# Patient Record
Sex: Female | Born: 1980 | Race: Black or African American | Hispanic: No | Marital: Single | Smoking: Never smoker
Health system: Southern US, Community
[De-identification: ages and names within clinical notes are randomized; demographics above are authoritative.]

## PROBLEM LIST (undated history)

## (undated) DIAGNOSIS — E66812 Obesity, class 2: Secondary | ICD-10-CM

## (undated) DIAGNOSIS — E049 Nontoxic goiter, unspecified: Secondary | ICD-10-CM

## (undated) DIAGNOSIS — J351 Hypertrophy of tonsils: Secondary | ICD-10-CM

## (undated) DIAGNOSIS — E669 Obesity, unspecified: Secondary | ICD-10-CM

## (undated) DIAGNOSIS — I10 Essential (primary) hypertension: Secondary | ICD-10-CM

## (undated) DIAGNOSIS — E785 Hyperlipidemia, unspecified: Secondary | ICD-10-CM

## (undated) DIAGNOSIS — J039 Acute tonsillitis, unspecified: Secondary | ICD-10-CM

## (undated) DIAGNOSIS — B009 Herpesviral infection, unspecified: Secondary | ICD-10-CM

## (undated) DIAGNOSIS — T7840XA Allergy, unspecified, initial encounter: Secondary | ICD-10-CM

## (undated) HISTORY — DX: Allergy, unspecified, initial encounter: T78.40XA

## (undated) HISTORY — DX: Obesity, unspecified: E66.9

## (undated) HISTORY — DX: Herpesviral infection, unspecified: B00.9

## (undated) HISTORY — DX: Essential (primary) hypertension: I10

## (undated) HISTORY — PX: APPENDECTOMY: SHX54

## (undated) HISTORY — DX: Hyperlipidemia, unspecified: E78.5

## (undated) HISTORY — DX: Obesity, class 2: E66.812

## (undated) HISTORY — DX: Nontoxic goiter, unspecified: E04.9

## (undated) HISTORY — PX: CHOLECYSTECTOMY: SHX55

---

## 2006-05-31 HISTORY — PX: APPENDECTOMY: SHX54

## 2007-03-11 ENCOUNTER — Inpatient Hospital Stay: Payer: Self-pay | Admitting: General Surgery

## 2007-06-14 ENCOUNTER — Ambulatory Visit: Payer: Self-pay | Admitting: Family Medicine

## 2008-02-14 IMAGING — CT CT ABD-PELV W/ CM
2 of 3 series · 15 of 42 positions shown, 19 images · non-contrast
Comparison: none

REASON FOR EXAM: (1) Abdominal pain; (2) Same
COMMENTS:

PROCEDURE:     CT  - CT ABDOMEN / PELVIS  W  - March 11, 2007 [DATE]
RESULT:     Comparison: No available comparison exam.
TECHNIQUE: CT examination of the abdomen and pelvis was performed after
intravenous administration of 100 cc of Xsovue-4FK nonionic contrast in
addition to oral contrast. Collimation is 3 mm. Coronal reformats were made.

[Series 2: appendicitis · axial · 0.72mm/px · z∈[-458,-50]mm · 12 of 157 slices shown, 16 images]
[im 14/157  soft-tissue]
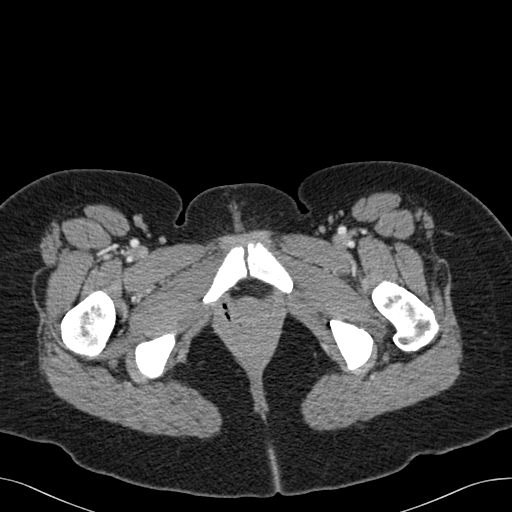
[im 14/157  bone]
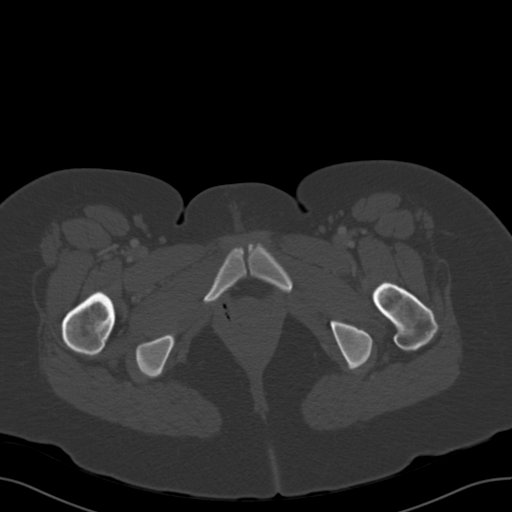
[im 27/157  soft-tissue]
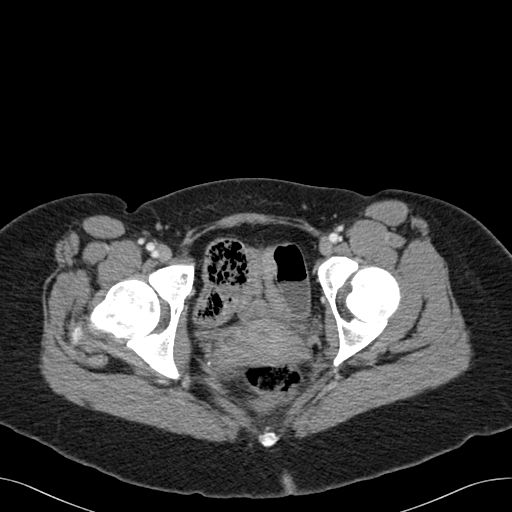
[im 40/157  soft-tissue]
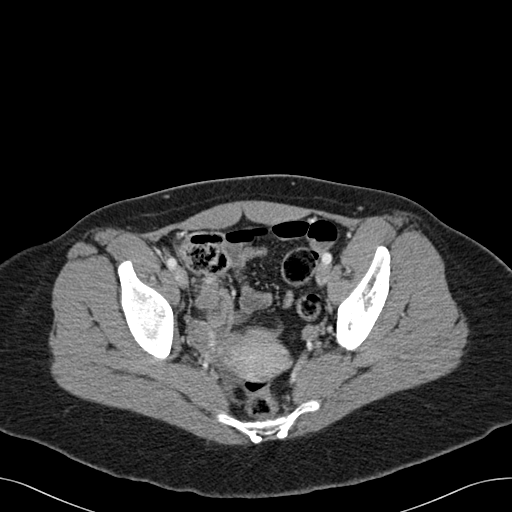
[im 59/157  soft-tissue]
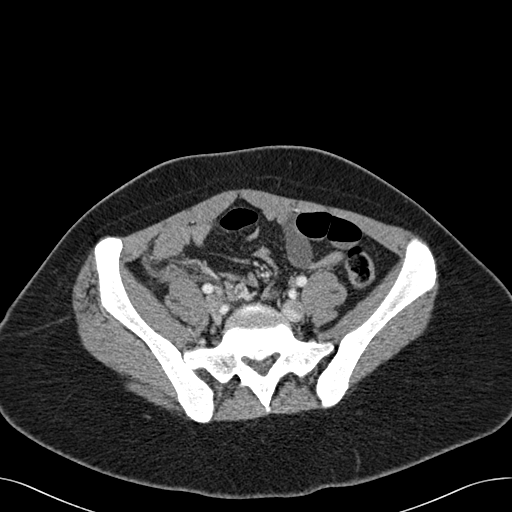
[im 72/157  soft-tissue]
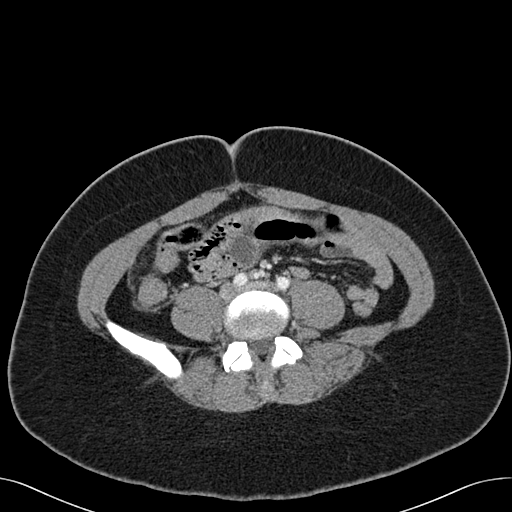
[im 85/157  soft-tissue]
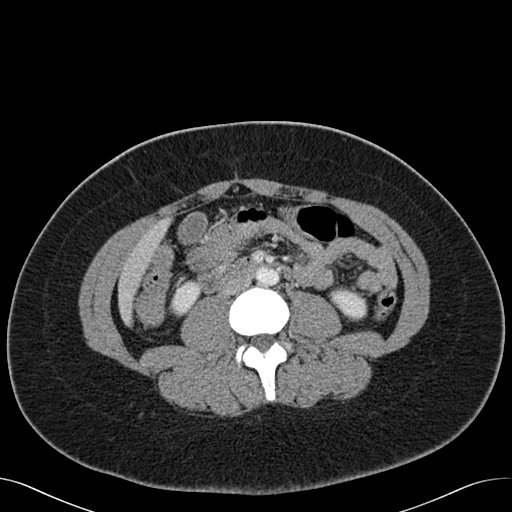
[im 98/157  soft-tissue]
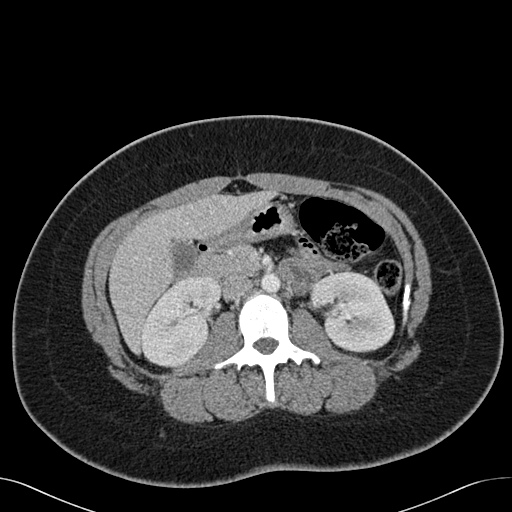
[im 118/157  soft-tissue]
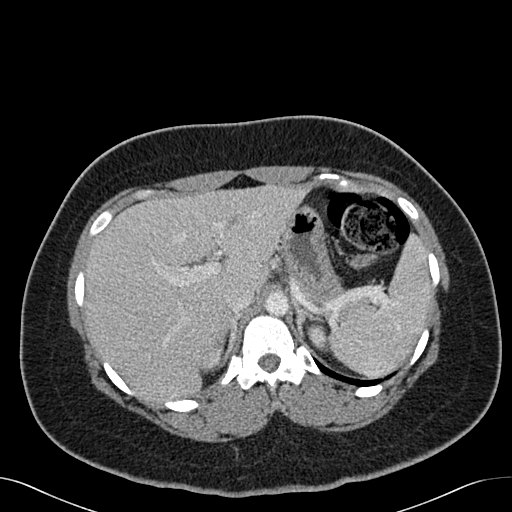
[im 131/157  soft-tissue]
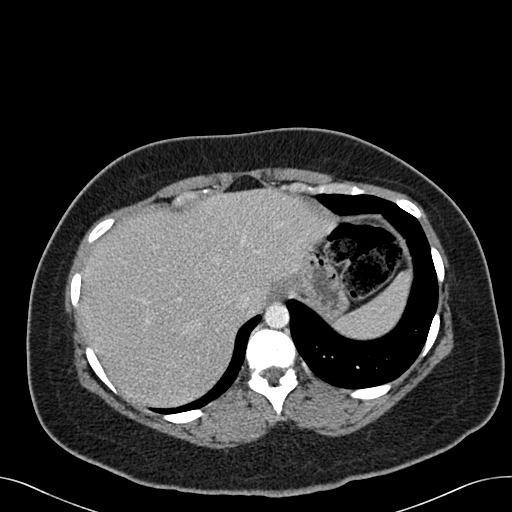
[im 131/157  lung]
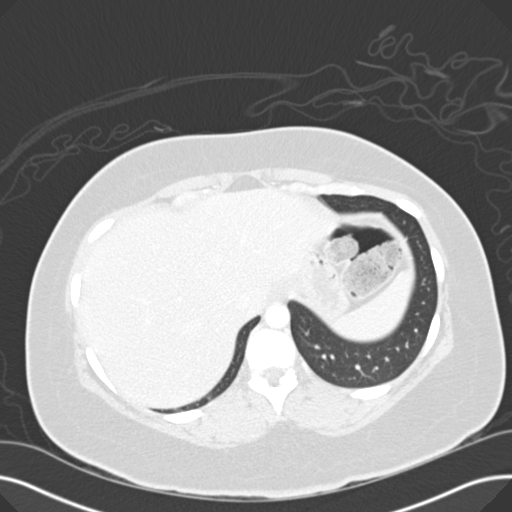
[im 131/157  bone]
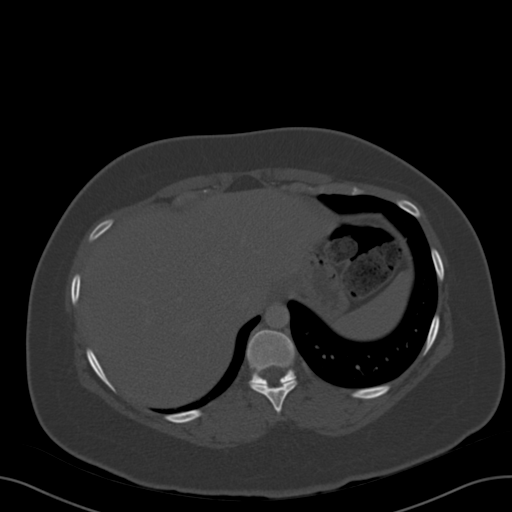
[im 137/157  lung]
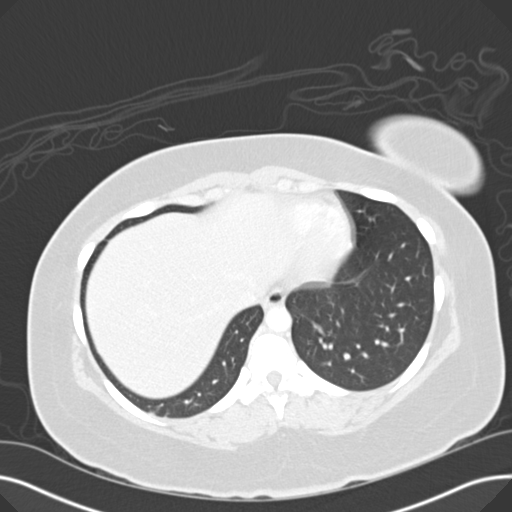
[im 144/157  soft-tissue]
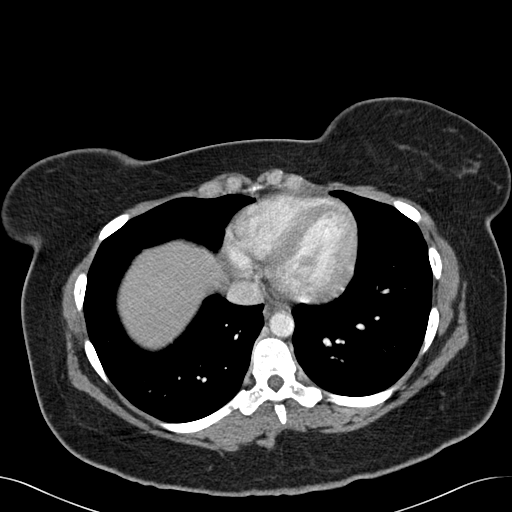
[im 144/157  lung]
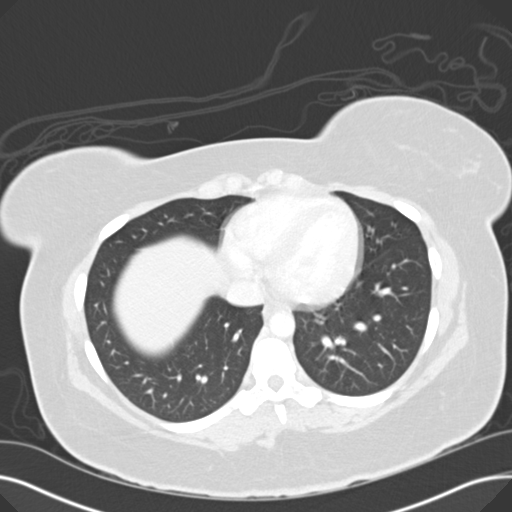
[im 150/157  lung]
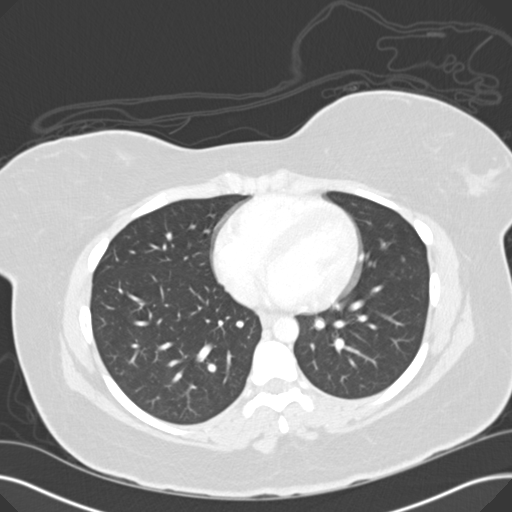

[Series 602: coronals · coronal · 0.93mm/px · 3 of 73 slices shown]
[im 25/73  soft-tissue]
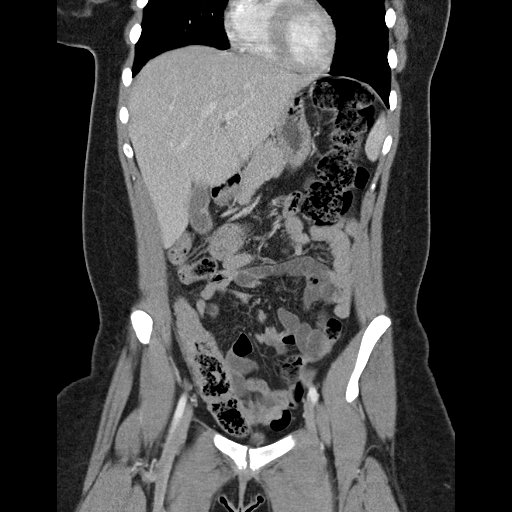
[im 33/73  soft-tissue]
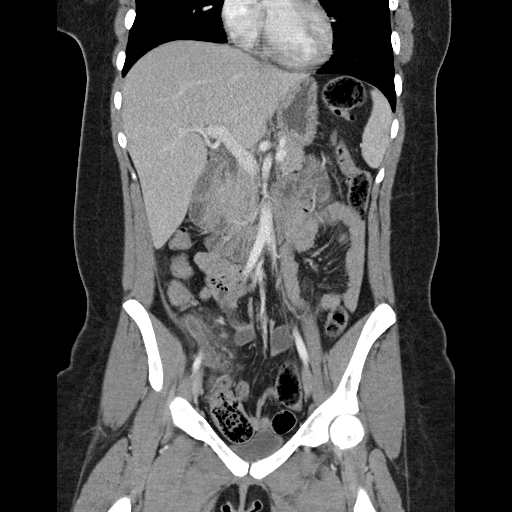
[im 41/73  soft-tissue]
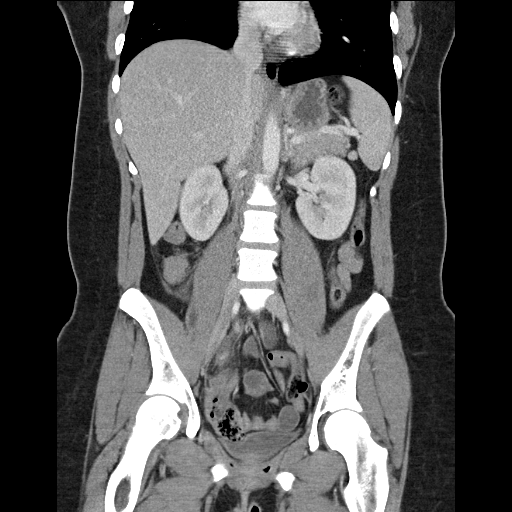

[15 of 42 positions shown; findings below may reference images not displayed]

FINDINGS: Limited evaluation of the lung bases is unremarkable

The liver, gallbladder, spleen, pancreas, adrenal glands and kidneys are
unremarkable. A small splenule is seen inferior to the splenic hilum.

The appendix is hard to follow from end to end. There is periappendiceal
stranding, fluid, as well as what appears to be small amount of extraluminal
gas. Findings are highly suggestive of perforated appendicitis. No discreet
abscess is noted. There is no bowel dilatation. There are no enlarged
abdominal pelvic lymph nodes. The uterus is present. 2.6 cm mildly
heterogeneously enhancing soft tissue nodule abutting the uterus likely
represents an exophytic fibroid.
IMPRESSION: 1. Findings are suggestive of perforated appendicitis.

2. 2.6 cm mildly heterogeneously  enhancing soft tissue nodule abutting the
uterus likely represents an exophytic fibroid.

Findings were discussed with Dr. Katt at [DATE] p.m. on 03/11/2007.

## 2013-01-29 LAB — HM PAP SMEAR: HM Pap smear: NORMAL

## 2013-05-10 ENCOUNTER — Ambulatory Visit: Payer: Self-pay | Admitting: Obstetrics and Gynecology

## 2013-05-10 LAB — CBC
HCT: 40.6 % (ref 35.0–47.0)
HGB: 13.8 g/dL (ref 12.0–16.0)
MCH: 29.5 pg (ref 26.0–34.0)
MCHC: 34.1 g/dL (ref 32.0–36.0)
MCV: 87 fL (ref 80–100)
Platelet: 317 10*3/uL (ref 150–440)
RBC: 4.68 10*6/uL (ref 3.80–5.20)
RDW: 12.8 % (ref 11.5–14.5)
WBC: 8.8 10*3/uL (ref 3.6–11.0)

## 2013-05-10 LAB — COMPREHENSIVE METABOLIC PANEL
Albumin: 4.1 g/dL (ref 3.4–5.0)
BUN: 9 mg/dL (ref 7–18)
Bilirubin,Total: 0.7 mg/dL (ref 0.2–1.0)
EGFR (African American): >60
EGFR (Non-African Amer.): >60
Glucose: 74 mg/dL (ref 65–99)
Potassium: 4 mmol/L (ref 3.5–5.1)
SGOT(AST): 23 U/L (ref 15–37)
Total Protein: 9 g/dL — ABNORMAL HIGH (ref 6.4–8.2)

## 2013-05-14 ENCOUNTER — Ambulatory Visit: Payer: Self-pay | Admitting: Anesthesiology

## 2013-05-14 DIAGNOSIS — Z9889 Other specified postprocedural states: Secondary | ICD-10-CM | POA: Insufficient documentation

## 2013-05-18 ENCOUNTER — Inpatient Hospital Stay: Payer: Self-pay | Admitting: Obstetrics and Gynecology

## 2013-05-18 HISTORY — PX: MYOMECTOMY: SHX85

## 2013-05-19 LAB — CBC
HCT: 33.7 % — ABNORMAL LOW (ref 35.0–47.0)
MCH: 28.6 pg (ref 26.0–34.0)
MCHC: 32.9 g/dL (ref 32.0–36.0)
MCV: 87 fL (ref 80–100)
Platelet: 257 10*3/uL (ref 150–440)
RDW: 12.9 % (ref 11.5–14.5)
WBC: 12.2 10*3/uL — ABNORMAL HIGH (ref 3.6–11.0)

## 2013-05-19 LAB — BASIC METABOLIC PANEL
Anion Gap: 6 — ABNORMAL LOW (ref 7–16)
BUN: 4 mg/dL — ABNORMAL LOW (ref 7–18)
Calcium, Total: 8.4 mg/dL — ABNORMAL LOW (ref 8.5–10.1)
Chloride: 108 mmol/L — ABNORMAL HIGH (ref 98–107)
Co2: 26 mmol/L (ref 21–32)
Creatinine: 0.82 mg/dL (ref 0.60–1.30)
EGFR (African American): >60
Potassium: 3.5 mmol/L (ref 3.5–5.1)
Sodium: 140 mmol/L (ref 136–145)

## 2013-05-19 LAB — MAGNESIUM: Magnesium: 1.8 mg/dL

## 2013-05-22 LAB — URINE CULTURE

## 2013-09-06 DIAGNOSIS — I10 Essential (primary) hypertension: Secondary | ICD-10-CM | POA: Insufficient documentation

## 2013-09-06 DIAGNOSIS — E669 Obesity, unspecified: Secondary | ICD-10-CM | POA: Insufficient documentation

## 2014-01-20 ENCOUNTER — Emergency Department: Payer: Self-pay | Admitting: Student

## 2014-01-20 LAB — URINALYSIS, COMPLETE
BILIRUBIN, UR: NEGATIVE
Bacteria: NONE SEEN
Blood: NEGATIVE
Glucose,UR: NEGATIVE mg/dL (ref 0–75)
LEUKOCYTE ESTERASE: NEGATIVE
Nitrite: NEGATIVE
Ph: 8 (ref 4.5–8.0)
SPECIFIC GRAVITY: 1.02 (ref 1.003–1.030)
WBC UR: <1 /HPF (ref 0–5)

## 2014-01-20 LAB — CBC WITH DIFFERENTIAL/PLATELET
BASOS ABS: 0.1 10*3/uL (ref 0.0–0.1)
Basophil %: 0.6 %
EOS PCT: 0.1 %
Eosinophil #: 0 10*3/uL (ref 0.0–0.7)
HCT: 43.8 % (ref 35.0–47.0)
HGB: 14.6 g/dL (ref 12.0–16.0)
Lymphocyte #: 1.1 10*3/uL (ref 1.0–3.6)
Lymphocyte %: 10.5 %
MCH: 29.3 pg (ref 26.0–34.0)
MCHC: 33.3 g/dL (ref 32.0–36.0)
MCV: 88 fL (ref 80–100)
MONOS PCT: 4.1 %
Monocyte #: 0.4 x10 3/mm (ref 0.2–0.9)
NEUTROS PCT: 84.7 %
Neutrophil #: 9 10*3/uL — ABNORMAL HIGH (ref 1.4–6.5)
Platelet: 300 10*3/uL (ref 150–440)
RBC: 4.97 10*6/uL (ref 3.80–5.20)
RDW: 12.4 % (ref 11.5–14.5)
WBC: 10.6 10*3/uL (ref 3.6–11.0)

## 2014-01-20 LAB — COMPREHENSIVE METABOLIC PANEL
ALK PHOS: 65 U/L
Albumin: 4 g/dL (ref 3.4–5.0)
Anion Gap: 10 (ref 7–16)
BUN: 6 mg/dL — AB (ref 7–18)
Bilirubin,Total: 0.8 mg/dL (ref 0.2–1.0)
CHLORIDE: 101 mmol/L (ref 98–107)
CO2: 25 mmol/L (ref 21–32)
CREATININE: 0.75 mg/dL (ref 0.60–1.30)
Calcium, Total: 8.7 mg/dL (ref 8.5–10.1)
EGFR (Non-African Amer.): >60
Glucose: 77 mg/dL (ref 65–99)
OSMOLALITY: 268 (ref 275–301)
Potassium: 3.5 mmol/L (ref 3.5–5.1)
SGOT(AST): 37 U/L (ref 15–37)
SGPT (ALT): 27 U/L
SODIUM: 136 mmol/L (ref 136–145)
Total Protein: 8.8 g/dL — ABNORMAL HIGH (ref 6.4–8.2)

## 2014-01-20 LAB — TROPONIN I

## 2014-01-20 LAB — LIPASE, BLOOD: LIPASE: 108 U/L (ref 73–393)

## 2014-01-29 ENCOUNTER — Ambulatory Visit: Payer: Self-pay | Admitting: Anesthesiology

## 2014-01-29 LAB — BASIC METABOLIC PANEL WITH GFR
Anion Gap: 5 — ABNORMAL LOW
BUN: 8 mg/dL
Calcium, Total: 8.8 mg/dL
Chloride: 106 mmol/L
Co2: 28 mmol/L
Creatinine: 0.8 mg/dL
EGFR (African American): >60
EGFR (Non-African Amer.): >60
Glucose: 78 mg/dL
Osmolality: 275
Potassium: 4.3 mmol/L
Sodium: 139 mmol/L

## 2014-01-29 LAB — HEPATIC FUNCTION PANEL A (ARMC)
Albumin: 3.1 g/dL — ABNORMAL LOW
Alkaline Phosphatase: 54 U/L
Bilirubin, Direct: <0.1 mg/dL
Bilirubin,Total: 0.4 mg/dL
SGOT(AST): 29 U/L
SGPT (ALT): 20 U/L
Total Protein: 7.8 g/dL

## 2014-01-29 LAB — CBC WITH DIFFERENTIAL/PLATELET
Basophil #: 0.1 10*3/uL (ref 0.0–0.1)
Basophil %: 1.3 %
Eosinophil #: 0.2 10*3/uL (ref 0.0–0.7)
Eosinophil %: 2.9 %
HCT: 35.8 % (ref 35.0–47.0)
HGB: 11.8 g/dL — ABNORMAL LOW (ref 12.0–16.0)
LYMPHS PCT: 28 %
Lymphocyte #: 2.4 10*3/uL (ref 1.0–3.6)
MCH: 28.5 pg (ref 26.0–34.0)
MCHC: 33.1 g/dL (ref 32.0–36.0)
MCV: 86 fL (ref 80–100)
MONO ABS: 0.6 x10 3/mm (ref 0.2–0.9)
MONOS PCT: 7.4 %
NEUTROS ABS: 5.1 10*3/uL (ref 1.4–6.5)
Neutrophil %: 60.4 %
Platelet: 357 10*3/uL (ref 150–440)
RBC: 4.14 10*6/uL (ref 3.80–5.20)
RDW: 12.7 % (ref 11.5–14.5)
WBC: 8.5 10*3/uL (ref 3.6–11.0)

## 2014-02-07 ENCOUNTER — Ambulatory Visit: Payer: Self-pay | Admitting: Surgery

## 2014-02-07 LAB — PLATELET COUNT: Platelet: 310 10*3/uL (ref 150–440)

## 2014-02-08 LAB — COMPREHENSIVE METABOLIC PANEL
ALBUMIN: 3.1 g/dL — AB (ref 3.4–5.0)
AST: 48 U/L — AB (ref 15–37)
Alkaline Phosphatase: 53 U/L
Anion Gap: 7 (ref 7–16)
BILIRUBIN TOTAL: 0.6 mg/dL (ref 0.2–1.0)
BUN: 6 mg/dL — ABNORMAL LOW (ref 7–18)
CALCIUM: 9 mg/dL (ref 8.5–10.1)
CREATININE: 0.71 mg/dL (ref 0.60–1.30)
Chloride: 105 mmol/L (ref 98–107)
Co2: 28 mmol/L (ref 21–32)
EGFR (African American): >60
Glucose: 87 mg/dL (ref 65–99)
Osmolality: 276 (ref 275–301)
POTASSIUM: 3.8 mmol/L (ref 3.5–5.1)
SGPT (ALT): 32 U/L
Sodium: 140 mmol/L (ref 136–145)
Total Protein: 7.3 g/dL (ref 6.4–8.2)

## 2014-02-08 LAB — CBC WITH DIFFERENTIAL/PLATELET
BASOS PCT: 0.7 %
Basophil #: 0.1 10*3/uL (ref 0.0–0.1)
Eosinophil #: 0 10*3/uL (ref 0.0–0.7)
Eosinophil %: 0.1 %
HCT: 34.9 % — ABNORMAL LOW (ref 35.0–47.0)
HGB: 11.4 g/dL — ABNORMAL LOW (ref 12.0–16.0)
LYMPHS PCT: 16.3 %
Lymphocyte #: 2.4 10*3/uL (ref 1.0–3.6)
MCH: 28.2 pg (ref 26.0–34.0)
MCHC: 32.7 g/dL (ref 32.0–36.0)
MCV: 86 fL (ref 80–100)
MONO ABS: 0.9 x10 3/mm (ref 0.2–0.9)
Monocyte %: 5.9 %
NEUTROS ABS: 11.6 10*3/uL — AB (ref 1.4–6.5)
NEUTROS PCT: 77 %
Platelet: 325 10*3/uL (ref 150–440)
RBC: 4.05 10*6/uL (ref 3.80–5.20)
RDW: 13.1 % (ref 11.5–14.5)
WBC: 15 10*3/uL — ABNORMAL HIGH (ref 3.6–11.0)

## 2014-02-08 LAB — PATHOLOGY REPORT

## 2014-03-08 LAB — LIPID PANEL
Cholesterol: 180 mg/dL (ref 0–200)
HDL: 36 mg/dL (ref 35–70)
LDL Cholesterol: 124 mg/dL
Triglycerides: 99 mg/dL (ref 40–160)

## 2014-03-08 LAB — HEMOGLOBIN A1C: Hgb A1c MFr Bld: 4.8 % (ref 4.0–6.0)

## 2014-05-31 HISTORY — PX: CHOLECYSTECTOMY: SHX55

## 2014-09-21 NOTE — H&P (Signed)
PATIENT NAME:  Kelsey Barnes, CALLIES MR#:  [id number] DATE OF BIRTH:  11-11-1980  DATE OF ADMISSION:  02/07/2014  CHIEF COMPLAINT: Right upper quadrant pain.   HISTORY OF PRESENT ILLNESS: This is a patient with recurrent and episodic right upper quadrant pain, associated fatty food intolerance, and workup showing gallstones. She is here for elective laparoscopic cholecystectomy for control of her symptoms. She was in the Emergency Room 2 weeks ago.   PAST MEDICAL HISTORY: Reflux disease.   PAST SURGICAL HISTORY: D and C and appendectomy.   ALLERGIES: PERCOCET AND TRAMADOL.   MEDICATIONS: Multiple, see chart. Of interest, she is taking oxycodone 5 mg every 6 hours, but claims to be allergic to Percocet.   REVIEW OF SYSTEMS: Ten system review has been performed and negative with the exception of that mentioned in the HPI and is documented in the office chart.   PHYSICAL EXAMINATION: GENERAL: Healthy female patient.  HEENT: No scleral icterus.  NECK: No palpable neck nodes.  CHEST: Clear to auscultation.  CARDIAC: Regular rate and rhythm.  ABDOMEN: Soft, nontender.  EXTREMITIES: Without edema.  NEUROLOGIC: Grossly intact.  INTEGUMENT: No jaundice.   DIAGNOSTIC DATA: An ultrasound shows gallstones.   LABORATORY VALUES: Hemoglobin and hematocrit of 11.8 and 35.8 with a platelet count 357,000. Normal liver function tests.   ASSESSMENT AND PLAN: This is a patient with symptomatic cholelithiasis. She is here for elective laparoscopic cholecystectomy. The rationale for surgery has been discussed. The options of observation have been reviewed. The risks of bleeding, infection, recurrence of symptoms, failure to resolve her symptoms, open procedure, bile duct damage, bile duct leak, retained common bile duct stone, any of which could require further surgery under ERCP, stent and papillotomy have all been discussed. She understood and agreed to proceed.    ____________________________ Jerrol Banana Burt Knack, MD rec:at D: 02/06/2014 16:01:38 ET T: 02/06/2014 16:16:43 ET JOB#: [id number]  cc: Jerrol Banana. Burt Knack, MD, <Dictator> Florene Glen MD ELECTRONICALLY SIGNED 02/06/2014 18:21

## 2014-09-21 NOTE — Op Note (Signed)
PATIENT NAME:  Kelsey Barnes, Kelsey Barnes MR#:  [id number] DATE OF BIRTH:  22-Jan-1981  DATE OF PROCEDURE:  02/07/2014  PREOPERATIVE DIAGNOSIS:  Symptomatic cholelithiasis.   POSTOPERATIVE DIAGNOSIS:  Acute cholecystitis with cholelithiasis.  PROCEDURE:  Laparoscopic cholecystectomy.   SURGEON:  Richard E. Burt Knack, MD.   ANESTHESIA:  General with endotracheal tube.   ASSISTANT:  Massie Bougie, PA-S.    INDICATIONS:  This is a patient with recurrent and episodic right upper quadrant pain associated with fatty food intolerance and work-up showing gallstones. Preoperatively, we discussed rationale for surgery, the options of observation, risks of bleeding, infection, recurrence of symptoms, failure to resolve her symptoms, open procedure, bile duct damage, bile duct leak, retained common bile duct stone, any of which could require further surgery and/or ERCP, stent and papillotomy. This was all reviewed for her in the preop holding area with her family. She understood and agreed to proceed.   FINDINGS:  Huge gallstones impacted in the neck and infundibulum of the gallbladder; extensive adhesions of an acute nature to edematous gallbladder with both acute and chronic changes.   Because of the large size of the edematous gallbladder and multiple large stones the epigastric port site required enlargement, which resulted in bleeding from the epigastric vessels, which required closure utilizing an Endo Close technique with an estimated blood loss of  approximately 250 mL.   DESCRIPTION OF PROCEDURE:  The patient was induced to general anesthesia, given IV antibiotics. VTE prophylaxis was in place. She was prepped and draped in a sterile fashion. Marcaine was infiltrated in the skin and subcutaneous tissues around the periumbilical area. Incision was made. Veress needle was placed. Pneumoperitoneum was obtained. A 5-mm trocar port was placed. The abdominal cavity was explored and under direct vision, a 10-mm  epigastric port and two lateral 5-mm ports were placed.   The gallbladder was found to be greatly edematous and enlarged and there were multiple fibrinous adhesions to the inflamed gallbladder. These were taken down bluntly without the use of energy. The gallbladder was then placed on tension. The peritoneum over the infundibulum was incised bluntly. Multiple stones were noted, palpable in the impacted infundibular area. The cystic duct/gallbladder junction was well identified. The cystic artery was well identified, doubly clipped and divided. The cystic lymphatics were doubly clipped and divided and then this allowed for good visualization of a small cystic duct entering the infundibulum of the gallbladder where there was a large impacted gallstone. This was doubly clipped and divided and the gallbladder was taken from the gallbladder fossa with electrocautery and passed out through the epigastric port site with the aid of an Endo Catch bag.    Because of the large size of the gallstones essentially filling the entire edematous gallbladder the epigastric port incision required enlargement and in doing so the epigastric vessels bled post removal. This was controlled utilizing at first attempts with 0-Vicryl figure-of-eight sutures placed with a UR-6 needle. This did not control the hemorrhage, therefore, under direct vision Endo Close technique was utilized with 0-Vicryl which adequately closed and stopped in the hemorrhage.   Irrigation was performed with 2 liters of irrigation to remove clotted blood. There was no sign of bleeding at the end of the procedure. No bile leak or bowel injury. At this point the pneumoperitoneum was released. All ports were removed and 4-0 subcuticular Monocryl was used on all skin edges. Additional Marcaine had been placed for a total of 30 mL, especially in the enlarged epigastric port site incision. Steri-Strips,  Mastisol and sterile dressings were placed. She was taken to the  recovery room in stable condition to be admitted for continued care. Single complication of hemorrhage from the epigastric port site, which was adequately controlled.    ____________________________ Jerrol Banana Burt Knack, MD rec:lt D: 02/07/2014 11:47:10 ET T: 02/07/2014 12:02:05 ET JOB#: [id number]  cc: Jerrol Banana. Burt Knack, MD, <Dictator> Florene Glen MD ELECTRONICALLY SIGNED 02/08/2014 11:24

## 2014-09-21 NOTE — Op Note (Signed)
PATIENT NAME:  Kelsey Barnes, Kelsey Barnes MR#:  [id number] DATE OF BIRTH:  January 13, 1981  DATE OF PROCEDURE:  05/18/2013  PREOPERATIVE DIAGNOSES: 1.  Abnormal uterine bleeding.  2.  Uterine fibroid.  3.  Suspected endometrial polyp.   POSTOPERATIVE DIAGNOSES: 1.  Abnormal uterine bleeding.  2.  Uterine fibroid.  3.  Suspected endometrial polyp.   PROCEDURES: 1.  Hysteroscopy.  2.  Dilation and curettage.  3.  Polypectomy.  4.  Abdominal myomectomy.   ANESTHESIA: General.   SURGEON: Prentice Docker, M.D.   ASSISTANT SURGEON:  Malachy Mood, M.D.   ESTIMATED BLOOD LOSS: 100 mL.  OPERATIVE FLUIDS: 1300 mL crystalloid.   COMPLICATIONS: None.   FINDINGS: 1.  Normal uterine cavity with small pedunculated apparent polyp.  2.  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   SPECIMENS: 1.  Endometrial curettings. 2.  Endometrial polyp.  3.  Uterine fibroid.   CONDITION AT THE END OF PROCEDURE: Stable.   PROCEDURE IN DETAIL: The patient was taken to the operating room where general anesthesia was administered and found to be adequate. She was placed in the dorsal supine lithotomy position in [place] stirrups and prepped and draped in the usual sterile fashion. After timeout was called, an indwelling catheter was placed in her bladder and a sterile speculum was placed in the vagina and the a single tooth tenaculum was used to grasp the anterior lip of the cervix. The uterus was sounded to a depth of approximately 8 cm. The cervix was then gently dilated in a serial fashion using Hegar dilators to a dilatation of 9 mm. The 7 mm hysteroscope was then gently introduced through the cervix into the uterine cavity with the above-noted findings. General curettage was undertaken with removal of the polyp and hemostasis was noted after replacing the hysteroscope. The single-tooth tenaculum was then removed from the cervix after removal of the hysteroscope and hemostasis was  obtained. All instrumentation was verified to be removed from the vagina at this point.   Attention was turned to the abdomen where an approximately 10 cm low vertical midline skin incision was made with a scalpel. It was carried through the various layers until the fascia was identified and entered sharply. This was extended vertically in both directions using curved Mayo scissors. The peritoneum was identified and entered sharply. The peritoneum was extended and the myoma was visualized to be right at the peritoneal opening. The base of the myoma was then grasped and elevated to the incisional opening. The uterus was visualized this time as well. Approximately 1.5 cm base was noted, and this was clamped using Heaney clamps. The myoma was then released from stalks using a scalpel. Myoma was then delivered from the abdomen using a double-tooth tenaculum. The stalk was then closed using a #0 Vicryl in a running locked fashion and a second layer was used in an imbricating fashion to close the serosa over the stalk. A single additional running stitch of 3-0 Vicryl had to be utilized to stop serosal bleeding. At this point once hemostasis was obtained an adhesion barrier was placed over the defect on the serosa in the fundus of the uterus. Hemostasis was noted and the uterus was returned to the abdomen. All laparotomy sponges and other instrumentation were removed from the abdomen. The fascia was closed using #0 Maxon in a running locked fashion. Two separate sutures starting at the vertical apices were utilized where they met midway along the incision and they were tied together. The subcutaneous  fat was closed using a 2-0 plain gut. The skin edges were reapproximated using 3-0 Vicryl to minimize tension on the closure. The skin was closed in a subcuticular fashion using 4-0 Monocryl. Dermabond was then used to seal the closure. The patient was injected with 25 mL of 5% Marcaine plain. At this point the procedure was  terminated.   The patient tolerated the procedure well. Sponge, lap, and needle counts were correct x 2. The patient received Ancef 2 grams prior to skin incision. The patient was wearing pneumatic compression stockings that were on and operating throughout the entire procedure.     ____________________________ Will Bonnet, MD  sdj:dp D: 05/18/2013 10:03:57 ET T: 05/18/2013 10:33:08 ET JOB#: [id number]  cc: Will Bonnet, MD, <Dictator> Will Bonnet MD ELECTRONICALLY SIGNED 06/15/2013 23:22

## 2014-11-22 ENCOUNTER — Other Ambulatory Visit: Payer: Self-pay | Admitting: Family Medicine

## 2014-12-13 ENCOUNTER — Encounter (INDEPENDENT_AMBULATORY_CARE_PROVIDER_SITE_OTHER): Payer: Self-pay

## 2014-12-13 ENCOUNTER — Ambulatory Visit (INDEPENDENT_AMBULATORY_CARE_PROVIDER_SITE_OTHER): Payer: BLUE CROSS/BLUE SHIELD | Admitting: Family Medicine

## 2014-12-13 ENCOUNTER — Encounter: Payer: Self-pay | Admitting: Family Medicine

## 2014-12-13 ENCOUNTER — Other Ambulatory Visit: Payer: Self-pay | Admitting: Family Medicine

## 2014-12-13 VITALS — BP 120/82 | HR 92 | Temp 98.1°F | Resp 16 | Ht 68.0 in | Wt 219.3 lb

## 2014-12-13 DIAGNOSIS — Z0000 Encounter for general adult medical examination without abnormal findings: Secondary | ICD-10-CM

## 2014-12-13 DIAGNOSIS — M533 Sacrococcygeal disorders, not elsewhere classified: Secondary | ICD-10-CM | POA: Diagnosis not present

## 2014-12-13 DIAGNOSIS — N946 Dysmenorrhea, unspecified: Secondary | ICD-10-CM

## 2014-12-13 DIAGNOSIS — Z01419 Encounter for gynecological examination (general) (routine) without abnormal findings: Secondary | ICD-10-CM

## 2014-12-13 DIAGNOSIS — Z124 Encounter for screening for malignant neoplasm of cervix: Secondary | ICD-10-CM

## 2014-12-13 DIAGNOSIS — E8881 Metabolic syndrome: Secondary | ICD-10-CM | POA: Insufficient documentation

## 2014-12-13 DIAGNOSIS — E559 Vitamin D deficiency, unspecified: Secondary | ICD-10-CM | POA: Insufficient documentation

## 2014-12-13 DIAGNOSIS — E785 Hyperlipidemia, unspecified: Secondary | ICD-10-CM | POA: Insufficient documentation

## 2014-12-13 DIAGNOSIS — J3089 Other allergic rhinitis: Secondary | ICD-10-CM | POA: Insufficient documentation

## 2014-12-13 DIAGNOSIS — R3129 Other microscopic hematuria: Secondary | ICD-10-CM | POA: Insufficient documentation

## 2014-12-13 DIAGNOSIS — A6009 Herpesviral infection of other urogenital tract: Secondary | ICD-10-CM | POA: Insufficient documentation

## 2014-12-13 DIAGNOSIS — E049 Nontoxic goiter, unspecified: Secondary | ICD-10-CM | POA: Insufficient documentation

## 2014-12-13 MED ORDER — VALACYCLOVIR HCL 500 MG PO TABS: 500.0000 mg | ORAL_TABLET | Freq: Every day | ORAL | Status: DC

## 2014-12-13 MED ORDER — NAPROXEN 500 MG PO TABS: 500.0000 mg | ORAL_TABLET | Freq: Two times a day (BID) | ORAL | Status: DC

## 2014-12-13 MED ORDER — NORINYL 1+35 (28) 1-35 MG-MCG PO TABS: 1.0000 | ORAL_TABLET | Freq: Every day | ORAL | Status: DC

## 2014-12-13 NOTE — Progress Notes (Signed)
Name: Kelsey Barnes   MRN: [id number]    DOB: 12-10-1980   Date:12/13/2014       Progress Note  Subjective  Chief Complaint  Chief Complaint  Patient presents with  . Annual Exam    HPI  Well woman: she has been feeling, exercising more, eating healthier , the only concern is tail bone pain that started a couple months ago. Works sitting down at home and has purchased a sacral cushion, but has not noticed an improvement of symptoms . She has history of falling on tail bone years ago   Patient Active Problem List   Diagnosis Date Noted  . Vitamin D deficiency 12/13/2014  . Perennial allergic rhinitis 12/13/2014  . Metabolic syndrome 08/65/7846  . Herpes genitalis in women 12/13/2014  . Goiter 12/13/2014  . Dyslipidemia 12/13/2014  . Hematuria, microscopic 12/13/2014  . Hypertension 09/06/2013  . Obesity (BMI 30-39.9) 09/06/2013  . H/O myomectomy 05/14/2013    Past Surgical History  Procedure Laterality Date  . Appendectomy    . Myomectomy  12.19.2014  . Gallbladder surgery      Family History  Problem Relation Age of Onset  . Hypertension Mother   . Diabetes Mother   . COPD Mother   . Hypertension Father   . Heart attack Father     History   Social History  . Marital Status: Single    Spouse Name: N/A  . Number of Children: N/A  . Years of Education: N/A   Occupational History  . Not on file.   Social History Main Topics  . Smoking status: Never Smoker   . Smokeless tobacco: Not on file  . Alcohol Use: 0.0 oz/week    0 Standard drinks or equivalent per week  . Drug Use: No  . Sexual Activity: Not on file   Other Topics Concern  . Not on file   Social History Narrative     Current outpatient prescriptions:  .  NORINYL 1+35, 28, tablet, Take 1 tablet by mouth daily., Disp: 84 tablet, Rfl: 4 .  valACYclovir (VALTREX) 500 MG tablet, Take 1 tablet (500 mg total) by mouth daily. And three times daily prn outbreak, Disp: 100 tablet, Rfl:  4  Allergies  Allergen Reactions  . Oxycodone-Acetaminophen Itching and Other (See Comments)    Hot Flashes     ROS  Constitutional: Negative for fever or weight change.  Respiratory: Negative for cough and shortness of breath.   Cardiovascular: Negative for chest pain or palpitations.  Gastrointestinal: Negative for abdominal pain, no bowel changes.  Musculoskeletal: Negative for gait problem or joint swelling. Sacral/coccygeal pain Skin: Negative for rash.  Neurological: Negative for dizziness or headache.  No other specific complaints in a complete review of systems (except as listed in HPI above).  Objective  Filed Vitals:   12/13/14 1202  BP: 120/82  Pulse: 92  Temp: 98.1 F (36.7 C)  Resp: 16  Height: 5\' 8"  (1.727 m)  Weight: 219 lb 5 oz (99.479 kg)  SpO2: 93%    Body mass index is 33.35 kg/(m^2).  Physical Exam  Constitutional: Patient appears well-developed and well-nourished. No distress. Obese HENT: Head: Normocephalic and atraumatic. Ears: B TMs ok, no erythema or effusion; Nose: Nose boggy turbinates. Mouth/Throat: Oropharynx is clear and moist. No oropharyngeal exudate.  Eyes: Conjunctivae and EOM are normal. Pupils are equal, round, and reactive to light. No scleral icterus.  Neck: Normal range of motion. Neck supple. No JVD present. No thyromegaly present.  Cardiovascular: Normal rate, regular rhythm and normal heart sounds.  No murmur heard. No BLE edema. Pulmonary/Chest: Effort normal and breath sounds normal. No respiratory distress. Abdominal: Soft. Bowel sounds are normal, no distension. There is no tenderness. no masses Breast: no lumps or masses, no nipple discharge or rashes FEMALE GENITALIA:  External genitalia normal External urethra normal Vaginal vault normal without discharge or lesions Cervix normal without discharge or lesions Bimanual exam normal without masses RECTAL: defered  Musculoskeletal: Normal range of motion, no joint  effusions. No gross deformities She has pain during palpation of sacral area Neurological: he is alert and oriented to person, place, and time. No cranial nerve deficit. Coordination, balance, strength, speech and gait are normal.  Skin: Skin is warm and dry. No rash noted. No erythema.  Psychiatric: Patient has a normal mood and affect. behavior is normal. Judgment and thought content normal.   PHQ2/9: Depression screen PHQ 2/9 12/13/2014  Decreased Interest 0  Down, Depressed, Hopeless 0  PHQ - 2 Score 0    Fall Risk: Fall Risk  12/13/2014  Falls in the past year? No    Assessment & Plans:   1. Well woman exam Discussed importance of 150 minutes of physical activity weekly, eat two servings of fish weekly, eat one serving of tree nuts ( cashews, pistachios, pecans, almonds.Marland Kitchen) every other day, eat 6 servings of fruit/vegetables daily and drink plenty of water and avoid sweet beverages.   2. Herpes genitalis in women  - valACYclovir (VALTREX) 500 MG tablet; Take 1 tablet (500 mg total) by mouth daily. And three times daily prn outbreak  Dispense: 100 tablet; Refill: 4  3. Dysmenorrhea  - NORINYL 1+35, 28, tablet; Take 1 tablet by mouth daily.  Dispense: 84 tablet; Refill: 4  4. Cervical cancer screening  - Cytology - PAP  5. Sacral pain We will try nsaid's , may need PT and X-ray if no improvement  Naproxen 500 mg twice daily  Continue sitting cushion and lean forward when sitting down

## 2014-12-18 LAB — PAP IG W/ RFLX HPV ASCU: PAP Smear Comment: 0

## 2014-12-26 IMAGING — US ABDOMEN ULTRASOUND LIMITED
1 series · 14 of 25 positions shown · non-contrast
Comparison: Renal ultrasound 06/14/2007.  Abdominal CT 03/11/2007.

CLINICAL DATA: Epigastric pain.  History of gallstones.

EXAM:
US ABDOMEN LIMITED - RIGHT UPPER QUADRANT

[Series 1: abdomen ultrasound limited · 0.25mm/px · 14 of 46 slices shown]
[im 1/46]
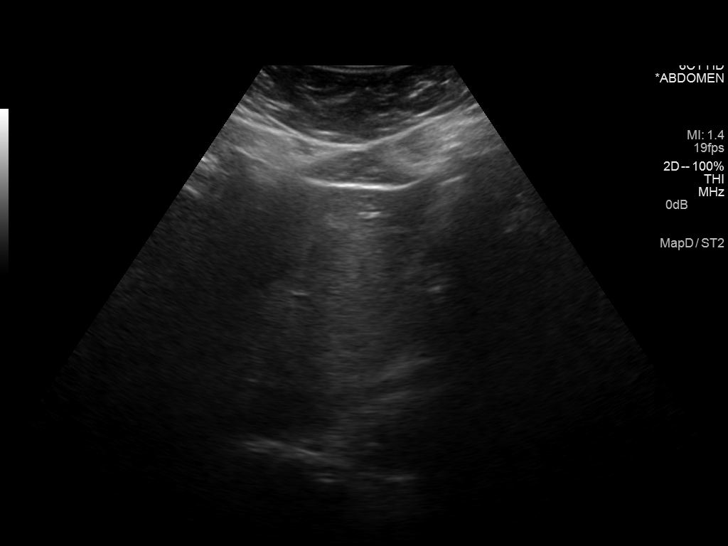
[im 4/46]
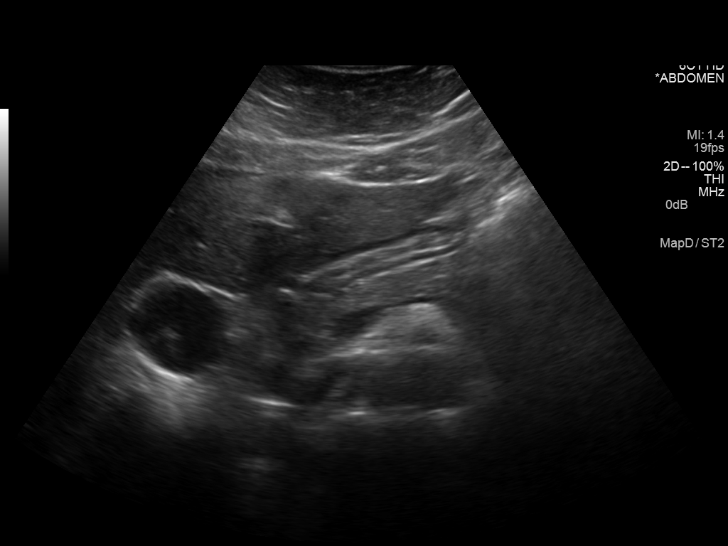
[im 8/46]
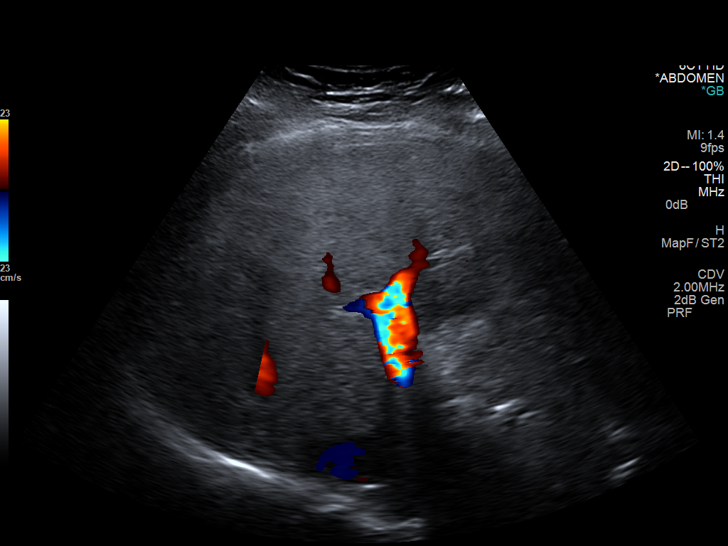
[im 12/46]
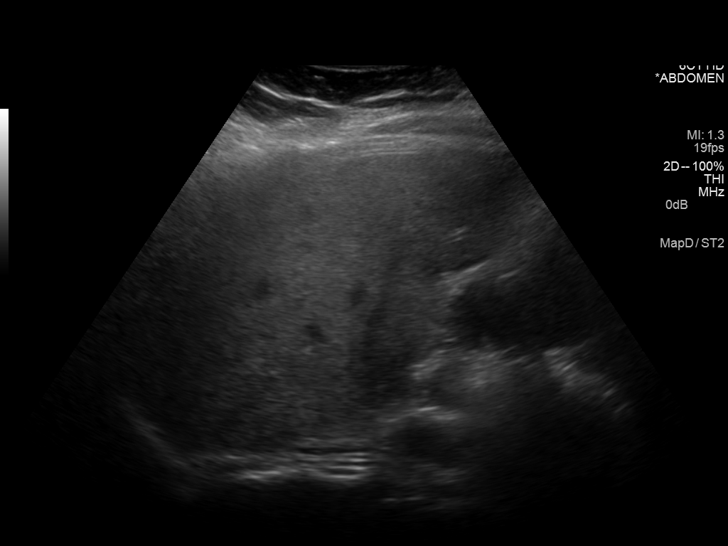
[im 16/46]
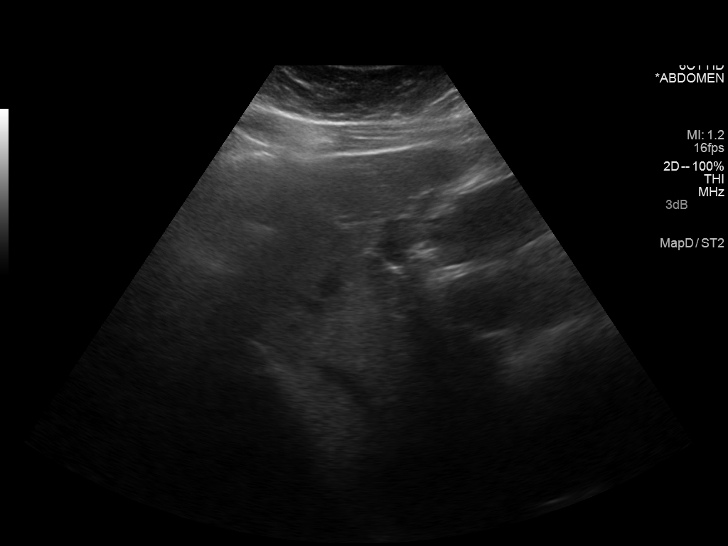
[im 17/46]
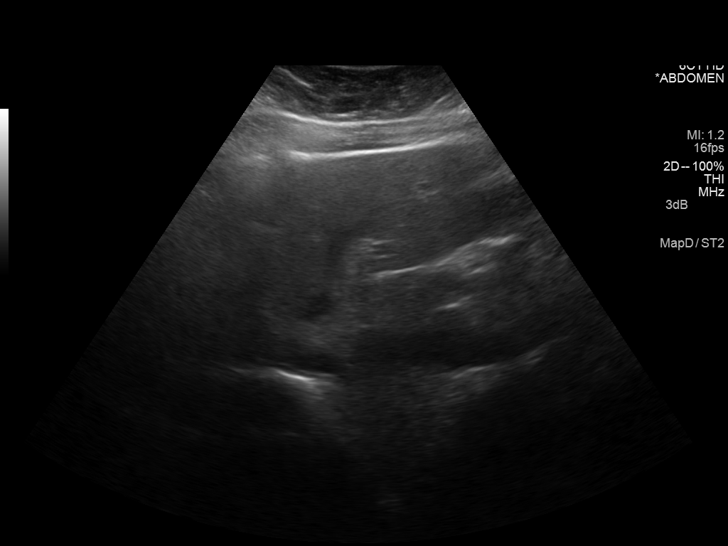
[im 21/46]
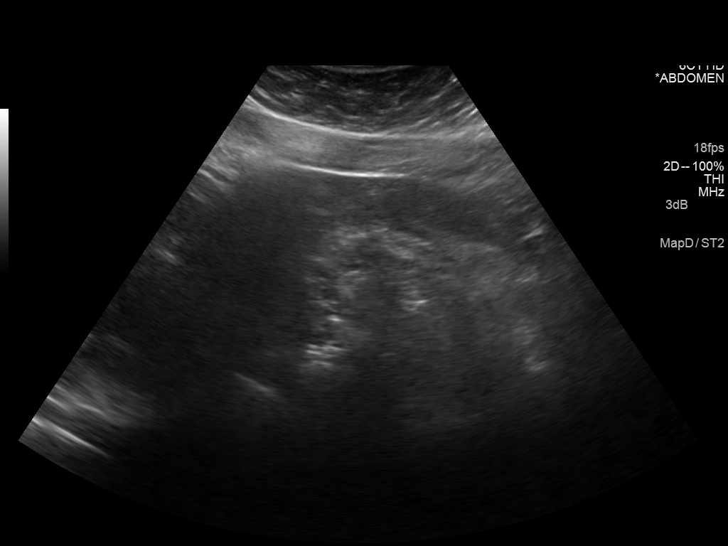
[im 25/46]
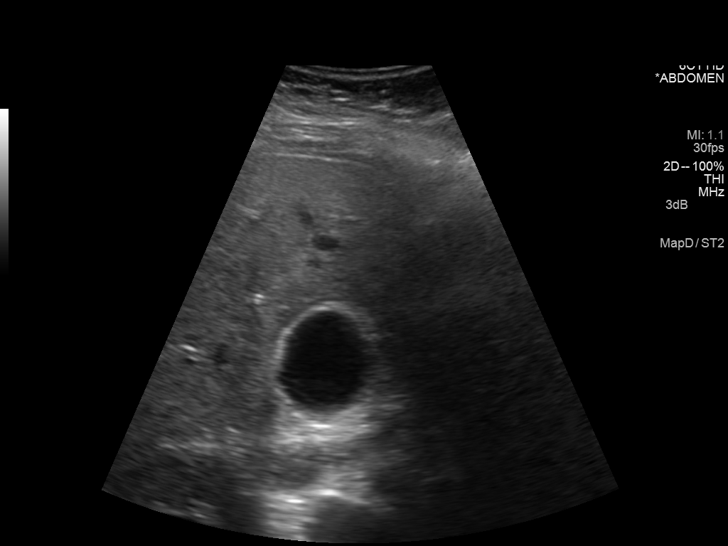
[im 29/46]
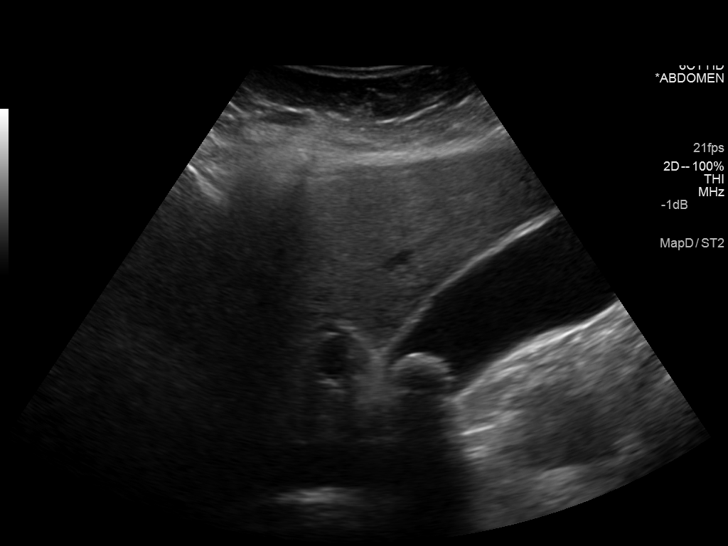
[im 31/46]
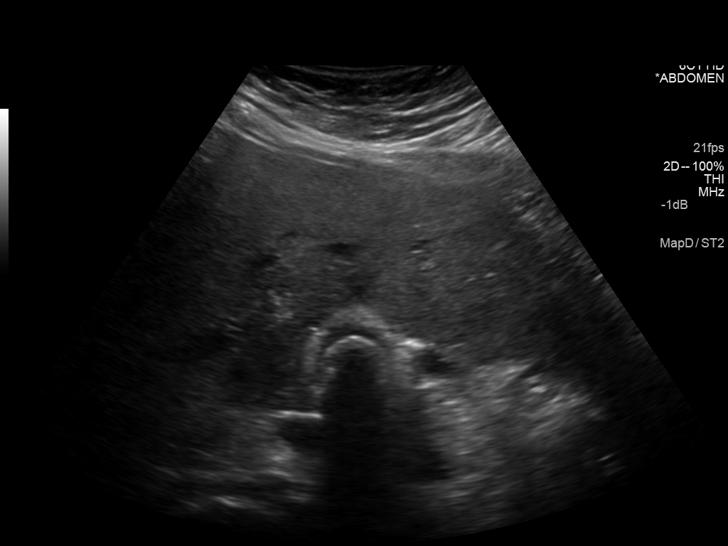
[im 34/46]
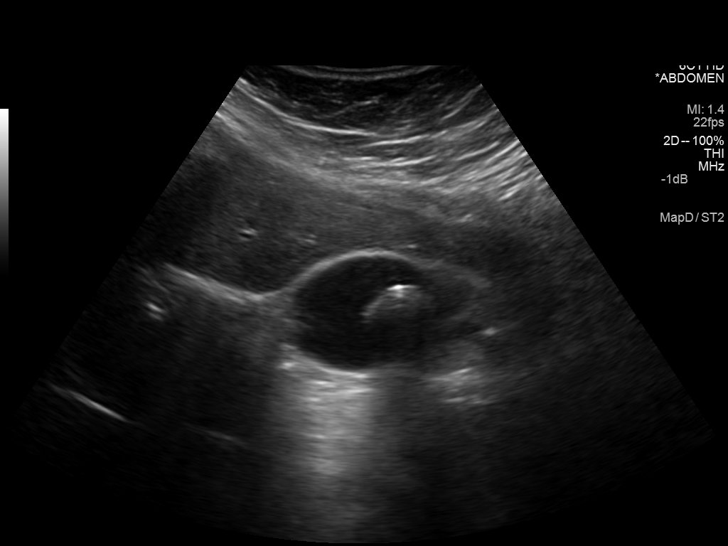
[im 38/46]
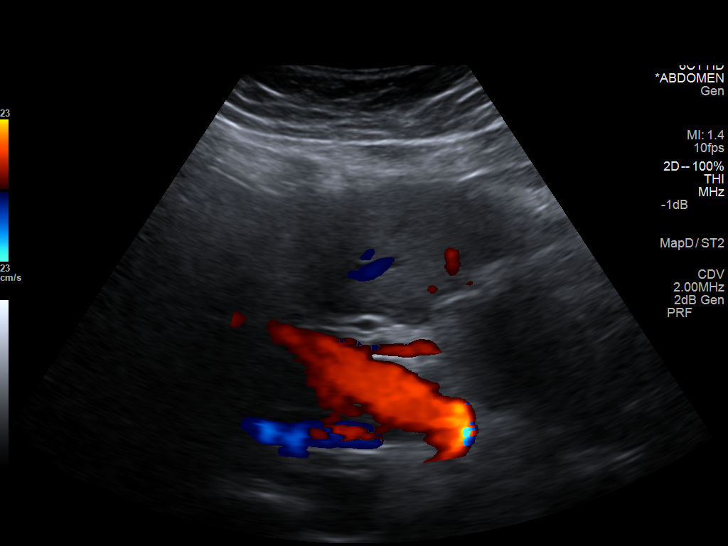
[im 42/46]
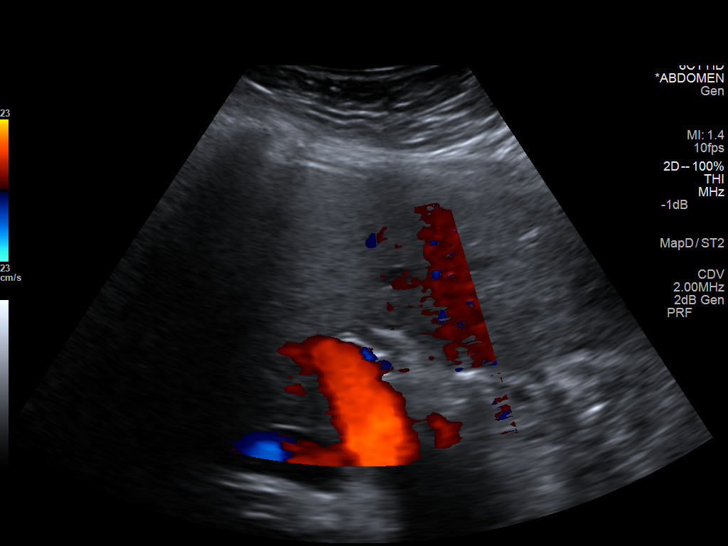
[im 46/46]
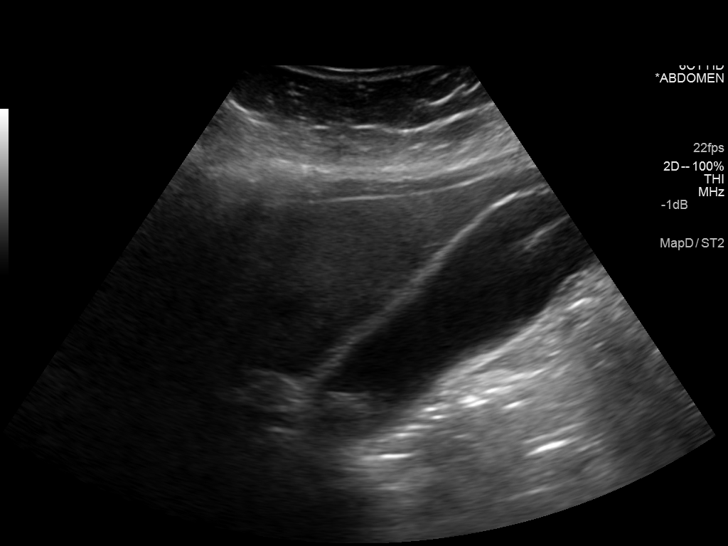

[14 of 25 positions shown; findings below may reference images not displayed]

FINDINGS: Gallbladder:

There is a 2.1 cm gallstone within the gallbladder neck which could
not be moved by changing the patient's position. There is no
gallbladder wall thickening or pericholecystic fluid. Sonographic
Murphy's sign is absent.

Common bile duct:

Diameter: 5.4 mm.  No evidence of choledocholithiasis.

Liver:

Increased hepatic echogenicity without focal abnormality.
IMPRESSION: 1. Large non mobile gallstone within the gallbladder neck. No
evidence of cholecystitis or biliary dilatation.
2. Increased hepatic echogenicity most consistent with steatosis.

## 2015-04-18 ENCOUNTER — Encounter: Payer: Self-pay | Admitting: Family Medicine

## 2015-04-18 ENCOUNTER — Ambulatory Visit (INDEPENDENT_AMBULATORY_CARE_PROVIDER_SITE_OTHER): Payer: BLUE CROSS/BLUE SHIELD | Admitting: Family Medicine

## 2015-04-18 VITALS — BP 132/86 | HR 68 | Temp 98.4°F | Resp 16 | Ht 68.0 in | Wt 215.5 lb

## 2015-04-18 DIAGNOSIS — E669 Obesity, unspecified: Secondary | ICD-10-CM | POA: Diagnosis not present

## 2015-04-18 DIAGNOSIS — Z23 Encounter for immunization: Secondary | ICD-10-CM | POA: Diagnosis not present

## 2015-04-18 DIAGNOSIS — A609 Anogenital herpesviral infection, unspecified: Secondary | ICD-10-CM | POA: Diagnosis not present

## 2015-04-18 DIAGNOSIS — E559 Vitamin D deficiency, unspecified: Secondary | ICD-10-CM | POA: Diagnosis not present

## 2015-04-18 DIAGNOSIS — E785 Hyperlipidemia, unspecified: Secondary | ICD-10-CM

## 2015-04-18 DIAGNOSIS — J300 Vasomotor rhinitis: Secondary | ICD-10-CM | POA: Diagnosis not present

## 2015-04-18 DIAGNOSIS — M533 Sacrococcygeal disorders, not elsewhere classified: Secondary | ICD-10-CM

## 2015-04-18 DIAGNOSIS — A6009 Herpesviral infection of other urogenital tract: Secondary | ICD-10-CM

## 2015-04-18 DIAGNOSIS — I10 Essential (primary) hypertension: Secondary | ICD-10-CM

## 2015-04-18 DIAGNOSIS — E049 Nontoxic goiter, unspecified: Secondary | ICD-10-CM

## 2015-04-18 DIAGNOSIS — Z114 Encounter for screening for human immunodeficiency virus [HIV]: Secondary | ICD-10-CM | POA: Diagnosis not present

## 2015-04-18 MED ORDER — ATENOLOL 25 MG PO TABS: 25.0000 mg | ORAL_TABLET | Freq: Every day | ORAL | Status: DC

## 2015-04-18 MED ORDER — LISINOPRIL-HYDROCHLOROTHIAZIDE 20-12.5 MG PO TABS: 1.0000 | ORAL_TABLET | Freq: Every day | ORAL | Status: DC

## 2015-04-18 MED ORDER — AZELASTINE HCL 0.1 % NA SOLN: 2.0000 | Freq: Two times a day (BID) | NASAL | Status: DC

## 2015-04-18 MED ORDER — VITAMIN D 50 MCG (2000 UT) PO CAPS: 1.0000 | ORAL_CAPSULE | Freq: Every day | ORAL | Status: DC

## 2015-04-18 NOTE — Progress Notes (Signed)
Name: Kelsey Barnes   MRN: [id number]    DOB: 1981/05/10   Date:04/18/2015       Progress Note  Subjective  Chief Complaint  Chief Complaint  Patient presents with  . Medication Refill    6 month F/U  . Hypertension  . Obesity    Patient read side effects of Belviq and never started medication. But patient is doing smoothy cleanses once monthly for 1 week at a time.     HPI  Sacral pain: she fell while in high school, and slipped on water and fell on basketball court and somebody else fell behind her and hit her with his foot on the same spot. She has intermittent pain on the sacrum, described as sharp pain, not present when sitting, but has pain when first stands up. Occasionally pain goes down lateral thigh  HTN: taking medication, denies side effects, no chest pain or palpitation.   Obesity: doing well on juicing and exercise regiment, losing weight, never filled Belviq prescription. Continue the good work.   Herpes Genitalis: no outbreaks for over one year, doing well on prophylactic medication   Vasomotor rhinitis: she states when she eats, exercises or temperature change her nose runs water, no congestion.   Patient Active Problem List   Diagnosis Date Noted  . Vitamin D deficiency 12/13/2014  . Perennial allergic rhinitis 12/13/2014  . Metabolic syndrome [id number]  . Herpes genitalis in women 12/13/2014  . Goiter 12/13/2014  . Dyslipidemia 12/13/2014  . Hematuria, microscopic 12/13/2014  . Hypertension 09/06/2013  . Obesity (BMI 30-39.9) 09/06/2013  . H/O myomectomy 05/14/2013    Past Surgical History  Procedure Laterality Date  . Appendectomy    . Myomectomy  12.19.2014  . Gallbladder surgery      Family History  Problem Relation Age of Onset  . Hypertension Mother   . Diabetes Mother   . COPD Mother   . Hypertension Father   . Heart attack Father     Social History   Social History  . Marital Status: Single    Spouse Name: N/A  . Number of  Children: N/A  . Years of Education: N/A   Occupational History  . Not on file.   Social History Main Topics  . Smoking status: Never Smoker   . Smokeless tobacco: Never Used  . Alcohol Use: 0.0 oz/week    0 Standard drinks or equivalent per week     Comment: occasionally  . Drug Use: No  . Sexual Activity: No   Other Topics Concern  . Not on file   Social History Narrative     Current outpatient prescriptions:  .  atenolol (TENORMIN) 25 MG tablet, Take 1 tablet (25 mg total) by mouth daily., Disp: 90 tablet, Rfl: 1 .  azelastine (ASTELIN) 0.1 % nasal spray, Place 2 sprays into both nostrils 2 (two) times daily. Use in each nostril as directed, Disp: 30 mL, Rfl: 2 .  cetirizine (ZYRTEC) 10 MG tablet, Take 10 mg by mouth daily., Disp: , Rfl:  .  lisinopril-hydrochlorothiazide (PRINZIDE,ZESTORETIC) 20-12.5 MG tablet, Take 1 tablet by mouth daily., Disp: 90 tablet, Rfl: 1 .  NORINYL 1+35, 28, tablet, Take 1 tablet by mouth daily., Disp: 84 tablet, Rfl: 4 .  valACYclovir (VALTREX) 500 MG tablet, Take 1 tablet (500 mg total) by mouth daily. And three times daily prn outbreak, Disp: 100 tablet, Rfl: 4  Allergies  Allergen Reactions  . Oxycodone-Acetaminophen Itching and Other (See Comments)    Hot  Flashes Hot Flashes     ROS  Constitutional: Negative for fever , mild weight change.  Respiratory: Negative for cough and shortness of breath.   Cardiovascular: Negative for chest pain or palpitations.  Gastrointestinal: Negative for abdominal pain, no bowel changes.  Musculoskeletal: Negative for gait problem or joint swelling.  Skin: Negative for rash.  Neurological: Negative for dizziness or headache.  No other specific complaints in a complete review of systems (except as listed in HPI above).  Objective  Filed Vitals:   04/18/15 1416  BP: 132/86  Pulse: 68  Temp: 98.4 F (36.9 C)  TempSrc: Oral  Resp: 16  Height: 5\' 8"  (1.727 m)  Weight: 215 lb 8 oz (97.75 kg)   SpO2: 96%    Body mass index is 32.77 kg/(m^2).  Physical Exam  Constitutional: Patient appears well-developed and well-nourished. Obese  No distress.  HEENT: head atraumatic, normocephalic, pupils equal and reactive to light, ears normal TM bilaterally , neck supple, throat within normal limits, thyromegaly - right side larger than left  Cardiovascular: Normal rate, regular rhythm and normal heart sounds.  No murmur heard. No BLE edema. Pulmonary/Chest: Effort normal and breath sounds normal. No respiratory distress. Abdominal: Soft.  There is no tenderness. Psychiatric: Patient has a normal mood and affect. behavior is normal. Judgment and thought content normal. Muscular Skeletal: fat pad over sacrum, per patient it has been present for years, soft to touch.    PHQ2/9:  Depression screen [facility] 2/9 04/18/2015 12/13/2014  Decreased Interest 0 0  Down, Depressed, Hopeless 0 0  PHQ - 2 Score 0 0     Fall Risk: Fall Risk  04/18/2015 12/13/2014  Falls in the past year? No No      Functional Status Survey: Is the patient deaf or have difficulty hearing?: No Does the patient have difficulty seeing, even when wearing glasses/contacts?: Yes (glasses) Does the patient have difficulty concentrating, remembering, or making decisions?: No Does the patient have difficulty walking or climbing stairs?: No Does the patient have difficulty dressing or bathing?: No Does the patient have difficulty doing errands alone such as visiting a doctor's office or shopping?: No   Assessment & Plan  1. Essential hypertension  Continue medication, at goal, no side effects - lisinopril-hydrochlorothiazide (PRINZIDE,ZESTORETIC) 20-12.5 MG tablet; Take 1 tablet by mouth daily.  Dispense: 90 tablet; Refill: 1 - atenolol (TENORMIN) 25 MG tablet; Take 1 tablet (25 mg total) by mouth daily.  Dispense: 90 tablet; Refill: 1 - Comprehensive metabolic panel  2. Needs flu shot  - Flu Vaccine QUAD 36+ mos PF  IM (Fluarix & Fluzone Quad PF)  3. Dyslipidemia  - Lipid panel  4. Vitamin D deficiency  She stopped taking supplementation and advised to resum eit   5. Herpes genitalis in women  No recent outbreaks, taking medication daily for prevention   6. Screening for HIV (human immunodeficiency virus)  - HIV antibody  7. Vasomotor rhinitis  - azelastine (ASTELIN) 0.1 % nasal spray; Place 2 sprays into both nostrils 2 (two) times daily. Use in each nostril as directed  Dispense: 30 mL; Refill: 2  8. Goiter  Seen by Dr. Ronnald Collum in the past, normal scan - TSH  9. Obesity (BMI 30-39.9)  Stopped Belviq because of possible side effects, but doing well, lost almost 4 lbs since last visit, juicing and exercising.   10. Sacral pain  She has a fatty pad over sacrum, symptoms are intermittent, improved with Naproxen, worse when doing sit ups  or crunches, better with rest or use of NSAID's, did not have X-ray done, but states feeling better and wants to hold off for now, advised to take Tylenol prn instead

## 2015-04-19 LAB — COMPREHENSIVE METABOLIC PANEL
ALBUMIN: 4.8 g/dL (ref 3.5–5.5)
ALK PHOS: 50 IU/L (ref 39–117)
ALT: 31 IU/L (ref 0–32)
AST: 32 IU/L (ref 0–40)
Albumin/Globulin Ratio: 1.5 (ref 1.1–2.5)
BILIRUBIN TOTAL: 0.5 mg/dL (ref 0.0–1.2)
BUN / CREAT RATIO: 10 (ref 8–20)
BUN: 8 mg/dL (ref 6–20)
CHLORIDE: 98 mmol/L (ref 97–106)
CO2: 25 mmol/L (ref 18–29)
CREATININE: 0.79 mg/dL (ref 0.57–1.00)
Calcium: 9.9 mg/dL (ref 8.7–10.2)
GFR calc Af Amer: 114 mL/min/{1.73_m2} (ref >59)
GFR calc non Af Amer: 99 mL/min/{1.73_m2} (ref >59)
GLUCOSE: 82 mg/dL (ref 65–99)
Globulin, Total: 3.2 g/dL (ref 1.5–4.5)
Potassium: 5 mmol/L (ref 3.5–5.2)
Sodium: 140 mmol/L (ref 136–144)
Total Protein: 8 g/dL (ref 6.0–8.5)

## 2015-04-19 LAB — LIPID PANEL
CHOLESTEROL TOTAL: 210 mg/dL — AB (ref 100–199)
Chol/HDL Ratio: 4.9 ratio units — ABNORMAL HIGH (ref 0.0–4.4)
HDL: 43 mg/dL (ref >39)
LDL CALC: 147 mg/dL — AB (ref 0–99)
Triglycerides: 98 mg/dL (ref 0–149)
VLDL CHOLESTEROL CAL: 20 mg/dL (ref 5–40)

## 2015-04-19 LAB — HIV ANTIBODY (ROUTINE TESTING W REFLEX): HIV SCREEN 4TH GENERATION: NONREACTIVE

## 2015-04-19 LAB — TSH: TSH: 0.858 u[IU]/mL (ref 0.450–4.500)

## 2015-05-19 ENCOUNTER — Other Ambulatory Visit: Payer: Self-pay | Admitting: Family Medicine

## 2015-07-17 ENCOUNTER — Ambulatory Visit (INDEPENDENT_AMBULATORY_CARE_PROVIDER_SITE_OTHER): Payer: BLUE CROSS/BLUE SHIELD | Admitting: Family Medicine

## 2015-07-17 ENCOUNTER — Encounter: Payer: Self-pay | Admitting: Family Medicine

## 2015-07-17 VITALS — BP 120/68 | HR 101 | Temp 101.1°F | Resp 18 | Wt 222.4 lb

## 2015-07-17 DIAGNOSIS — R509 Fever, unspecified: Secondary | ICD-10-CM

## 2015-07-17 DIAGNOSIS — J029 Acute pharyngitis, unspecified: Secondary | ICD-10-CM

## 2015-07-17 LAB — POCT INFLUENZA A/B
INFLUENZA A, POC: NEGATIVE
INFLUENZA B, POC: NEGATIVE

## 2015-07-17 LAB — POCT RAPID STREP A (OFFICE): Rapid Strep A Screen: NEGATIVE

## 2015-07-17 NOTE — Progress Notes (Signed)
Name: Kelsey Barnes   MRN: [id number]    DOB: 18-Feb-1981   Date:07/17/2015       Progress Note  Subjective  Chief Complaint  Chief Complaint  Patient presents with  . Sore Throat    Onset 6 days, fever, sore throat, ear pain and pressure on right ear, trouble swallowing.    HPI  Sore throat: she states symptoms started 6 days ago with bilateral sore throat, and muscle aches, symptoms are getting progressively worse. Yesterday pain started to localize on the right side and radiate to right ear ( right tonsil usually larger than left but is bigger and red and had some pus pockets) . Fever, headache. She has very mild cough, she has chronic daily clear rhinorrhea but no change in that.   Patient Active Problem List   Diagnosis Date Noted  . Vitamin D deficiency 12/13/2014  . Perennial allergic rhinitis 12/13/2014  . Metabolic syndrome [id number]  . Herpes genitalis in women 12/13/2014  . Goiter 12/13/2014  . Dyslipidemia 12/13/2014  . Hematuria, microscopic 12/13/2014  . Hypertension 09/06/2013  . Obesity (BMI 30-39.9) 09/06/2013  . H/O myomectomy 05/14/2013    Past Surgical History  Procedure Laterality Date  . Appendectomy    . Myomectomy  12.19.2014  . Gallbladder surgery      Family History  Problem Relation Age of Onset  . Hypertension Mother   . Diabetes Mother   . COPD Mother   . Hypertension Father   . Heart attack Father     Social History   Social History  . Marital Status: Single    Spouse Name: N/A  . Number of Children: N/A  . Years of Education: N/A   Occupational History  . Not on file.   Social History Main Topics  . Smoking status: Never Smoker   . Smokeless tobacco: Never Used  . Alcohol Use: 0.0 oz/week    0 Standard drinks or equivalent per week     Comment: occasionally  . Drug Use: No  . Sexual Activity: No   Other Topics Concern  . Not on file   Social History Narrative     Current outpatient prescriptions:  .  atenolol  (TENORMIN) 25 MG tablet, TAKE ONE TABLET BY MOUTH AT BEDTIME, Disp: 90 tablet, Rfl: 1 .  azelastine (ASTELIN) 0.1 % nasal spray, Place 2 sprays into both nostrils 2 (two) times daily. Use in each nostril as directed, Disp: 30 mL, Rfl: 2 .  cetirizine (ZYRTEC) 10 MG tablet, Take 10 mg by mouth daily., Disp: , Rfl:  .  Cholecalciferol (VITAMIN D) 2000 UNITS CAPS, Take 1 capsule (2,000 Units total) by mouth daily., Disp: 30 capsule, Rfl: 0 .  lisinopril-hydrochlorothiazide (PRINZIDE,ZESTORETIC) 20-12.5 MG tablet, TAKE ONE TABLET BY MOUTH ONCE DAILY, Disp: 90 tablet, Rfl: 1 .  NORINYL 1+35, 28, tablet, Take 1 tablet by mouth daily., Disp: 84 tablet, Rfl: 4 .  valACYclovir (VALTREX) 500 MG tablet, Take 1 tablet (500 mg total) by mouth daily. And three times daily prn outbreak, Disp: 100 tablet, Rfl: 4  Allergies  Allergen Reactions  . Oxycodone-Acetaminophen Itching and Other (See Comments)    Hot Flashes Hot Flashes     ROS  Ten systems reviewed and is negative except as mentioned in HPI   Objective  Filed Vitals:   07/17/15 1031  BP: 120/68  Pulse: 101  Temp: 101.1 F (38.4 C)  TempSrc: Oral  Resp: 18  Weight: 222 lb 6.4 oz (100.88 kg)  SpO2: 96%    Body mass index is 33.82 kg/(m^2).  Physical Exam  Constitutional: Patient appears well-developed and well-nourished. Obese  No distress.  HEENT: head atraumatic, normocephalic, pupils equal and reactive to light, ears normal TM bilaterally, neck supple, but pain with rom and palpation of right anterior cervical lymphonodi, throat showed 3 plus on right, pea size on left, erythema but no exudate.  Cardiovascular: Normal rate, regular rhythm and normal heart sounds.  No murmur heard. No BLE edema. Pulmonary/Chest: Effort normal and breath sounds normal. No respiratory distress. Abdominal: Soft.  There is no tenderness. Psychiatric: Patient has a normal mood and affect. behavior is normal. Judgment and thought content  normal.  Recent Results (from the past 2160 hour(s))  Lipid panel     Status: Abnormal   Collection Time: 04/18/15  3:06 PM  Result Value Ref Range   Cholesterol, Total 210 (H) 100 - 199 mg/dL   Triglycerides 98 0 - 149 mg/dL   HDL 43 >39 mg/dL   VLDL Cholesterol Cal 20 5 - 40 mg/dL   LDL Calculated 147 (H) 0 - 99 mg/dL   Chol/HDL Ratio 4.9 (H) 0.0 - 4.4 ratio units    Comment:                                   T. Chol/HDL Ratio                                             Men  Women                               1/2 Avg.Risk  3.4    3.3                                   Avg.Risk  5.0    4.4                                2X Avg.Risk  9.6    7.1                                3X Avg.Risk 23.4   11.0   Comprehensive metabolic panel     Status: None   Collection Time: 04/18/15  3:06 PM  Result Value Ref Range   Glucose 82 65 - 99 mg/dL   BUN 8 6 - 20 mg/dL   Creatinine, Ser 0.79 0.57 - 1.00 mg/dL   GFR calc non Af Amer 99 >59 mL/min/1.73   GFR calc Af Amer 114 >59 mL/min/1.73   BUN/Creatinine Ratio 10 8 - 20   Sodium 140 136 - 144 mmol/L   Potassium 5.0 3.5 - 5.2 mmol/L   Chloride 98 97 - 106 mmol/L   CO2 25 18 - 29 mmol/L   Calcium 9.9 8.7 - 10.2 mg/dL   Total Protein 8.0 6.0 - 8.5 g/dL   Albumin 4.8 3.5 - 5.5 g/dL   Globulin, Total 3.2 1.5 - 4.5 g/dL   Albumin/Globulin Ratio 1.5 1.1 - 2.5   Bilirubin Total  0.5 0.0 - 1.2 mg/dL   Alkaline Phosphatase 50 39 - 117 IU/L   AST 32 0 - 40 IU/L   ALT 31 0 - 32 IU/L  HIV antibody     Status: None   Collection Time: 04/18/15  3:06 PM  Result Value Ref Range   HIV Screen 4th Generation wRfx Non Reactive Non Reactive  TSH     Status: None   Collection Time: 04/18/15  3:06 PM  Result Value Ref Range   TSH 0.858 0.450 - 4.500 uIU/mL  POCT rapid strep A     Status: Normal   Collection Time: 07/17/15 11:03 AM  Result Value Ref Range   Rapid Strep A Screen Negative Negative  POCT Influenza A/B     Status: Normal   Collection Time:  07/17/15 11:03 AM  Result Value Ref Range   Influenza A, POC Negative Negative   Influenza B, POC Negative Negative    PHQ2/9: Depression screen [facility] 2/9 07/17/2015 04/18/2015 12/13/2014  Decreased Interest 0 0 0  Down, Depressed, Hopeless 0 0 0  PHQ - 2 Score 0 0 0     Fall Risk: Fall Risk  07/17/2015 04/18/2015 12/13/2014  Falls in the past year? No No No     Functional Status Survey: Is the patient deaf or have difficulty hearing?: No Does the patient have difficulty seeing, even when wearing glasses/contacts?: No Does the patient have difficulty concentrating, remembering, or making decisions?: No Does the patient have difficulty walking or climbing stairs?: No Does the patient have difficulty dressing or bathing?: No Does the patient have difficulty doing errands alone such as visiting a doctor's office or shopping?: No    Assessment & Plan  1. Sore throat  Right tonsil larger and displaced forward with tender right sided lymphadenopathy and fever, possible peri-tonsillar abscess, refer to ENT today - POCT rapid strep A - Ambulatory referral to ENT  2. Fever, unspecified fever cause  - POCT rapid strep A -negative - POCT Influenza A/B - negative

## 2015-07-28 ENCOUNTER — Telehealth: Payer: Self-pay

## 2015-07-28 NOTE — Telephone Encounter (Signed)
Patient called stating that when she went to the ENT, Dr. Carloyn Manner told her that she should not be taking Lisinopril b/c it could cause problems with her throat and especially since she has been having issues with her tonsils. He was suppose to be faxing over his recommendations of what she should be taking instead. She is going to have her tonsils removed and would like to switch BP meds due to other things she has heard about Lisinopril.   I told her that I would send Dr. Ancil Boozer a message and will give her a call back.  Please advise.

## 2015-07-28 NOTE — Telephone Encounter (Signed)
Patient was informed of Dr. Ancil Boozer message via voicemail.

## 2015-07-28 NOTE — Telephone Encounter (Signed)
I just reviewed notes from ENT and there is nothing about Lisinopril. Tell her to continue medication until her follow up with them

## 2015-10-07 DIAGNOSIS — J3501 Chronic tonsillitis: Secondary | ICD-10-CM | POA: Diagnosis not present

## 2015-10-13 NOTE — Discharge Instructions (Signed)
T & A INSTRUCTION SHEET - [facility] °[facility] ° °CREIGHTON VAUGHT, MD °PAUL H. JUENGEL, MD  °P. [facility]T °CHAPMAN MCQUEEN, MD ° °[address] [place], [place] [address] TEL. [phone] °[address] [place] [address] [phone] ° °INFORMATION SHEET FOR A TONSILLECTOMY AND ADENDOIDECTOMY ° °About Your Tonsils and Adenoids ° The tonsils and adenoids are normal body tissues that are part of our immune system.  They normally help to protect us against diseases that may enter our mouth and nose.  However, sometimes the tonsils and/or adenoids become too large and obstruct our breathing, especially at night. °  ° If either of these things happen it helps to remove the tonsils and adenoids in order to become healthier. The operation to remove the tonsils and adenoids is called a tonsillectomy and adenoidectomy. ° °The Location of Your Tonsils and Adenoids ° The tonsils are located in the back of the throat on both side and sit in a cradle of muscles. The adenoids are located in the roof of the mouth, behind the nose, and closely associated with the opening of the Eustachian tube to the ear. ° °Surgery on Tonsils and Adenoids ° A tonsillectomy and adenoidectomy is a short operation which takes about thirty minutes.  This includes being put to sleep and being awakened.  Tonsillectomies and adenoidectomies are performed at [facility] and may require observation period in the recovery room prior to going home. ° °Following the Operation for a Tonsillectomy ° A cautery machine is used to control bleeding.  Bleeding from a tonsillectomy and adenoidectomy is minimal and postoperatively the risk of bleeding is approximately four percent, although this rarely life threatening. ° ° ° °After your tonsillectomy and adenoidectomy post-op care at home: ° °1. Our patients are able to go home the same day.  You may be given prescriptions for pain  medications and antibiotics, if indicated. °2. It is extremely important to remember that fluid intake is of utmost importance after a tonsillectomy.  The amount that you drink must be maintained in the postoperative period.  A good indication of whether a child is getting enough fluid is whether his/her urine output is constant.  As long as children are urinating or wetting their diaper every 6 - 8 hours this is usually enough fluid intake.   °3. Although rare, this is a risk of some bleeding in the first ten days after surgery.  This is usually occurs between day five and nine postoperatively.  This risk of bleeding is approximately four percent.  If you or your child should have any bleeding you should remain calm and notify our office or go directly to the Emergency Room at  [facility] where they will contact us. Our doctors are available seven days a week for notification.  We recommend sitting up quietly in a chair, place an ice pack on the front of the neck and spitting out the blood gently until we are able to contact you.  Adults should gargle gently with ice water and this may help stop the bleeding.  If the bleeding does not stop after a short time, i.e. 10 to 15 minutes, or seems to be increasing again, please contact us or go to the hospital.   °4. It is common for the pain to be worse at 5 - 7 days postoperatively.  This occurs because the “scab” is peeling off and the mucous membrane (skin of   the throat) is growing back where the tonsils were.   °5. It is common for a low-grade fever, less than 102, during the first week after a tonsillectomy and adenoidectomy.  It is usually due to not drinking enough liquids, and we suggest your use liquid Tylenol or the pain medicine with Tylenol prescribed in order to keep your temperature below 102.  Please follow the directions on the back of the bottle. °6. Do not take aspirin or any products that contain aspirin such as Bufferin, Anacin,  Ecotrin, aspirin gum, Goodies, BC headache powders, etc., after a T&A because it can promote bleeding.  Please check with our office before administering any other medication that may been prescribed by other doctors during the two week post-operative period. °7. If you happen to look in the mirror or into your child’s mouth you will see white/gray patches on the back of the throat.  This is what a scab looks like in the mouth and is normal after having a T&A.  It will disappear once the tonsil area heals completely. However, it may cause a noticeable odor, and this too will disappear with time.     °8. You or your child may experience ear pain after having a T&A.  This is called referred pain and comes from the throat, but it is felt in the ears.  Ear pain is quite common and expected.  It will usually go away after ten days.  There is usually nothing wrong with the ears, and it is primarily due to the healing area stimulating the nerve to the ear that runs along the side of the throat.  Use either the prescribed pain medicine or Tylenol as needed.  °9. The throat tissues after a tonsillectomy are obviously sensitive.  Smoking around children who have had a tonsillectomy significantly increases the risk of bleeding.  DO NOT SMOKE!  ° °General Anesthesia, Adult, Care After °Refer to this sheet in the next few weeks. These instructions provide you with information on caring for yourself after your procedure. Your health care provider may also give you more specific instructions. Your treatment has been planned according to current medical practices, but problems sometimes occur. Call your health care provider if you have any problems or questions after your procedure. °WHAT TO EXPECT AFTER THE PROCEDURE °After the procedure, it is typical to experience: °· Sleepiness. °· Nausea and vomiting. °HOME CARE INSTRUCTIONS °· For the first 24 hours after general anesthesia: °¨ Have a responsible person with you. °¨ Do not  drive a car. If you are alone, do not take public transportation. °¨ Do not drink alcohol. °¨ Do not take medicine that has not been prescribed by your health care provider. °¨ Do not sign important papers or make important decisions. °¨ You may resume a normal diet and activities as directed by your health care provider. °· Change bandages (dressings) as directed. °· If you have questions or problems that seem related to general anesthesia, call the hospital and ask for the anesthetist or anesthesiologist on call. °SEEK MEDICAL CARE IF: °· You have nausea and vomiting that continue the day after anesthesia. °· You develop a rash. °SEEK IMMEDIATE MEDICAL CARE IF:  °· You have difficulty breathing. °· You have chest pain. °· You have any allergic problems. °  °This information is not intended to replace advice given to you by your health care provider. Make sure you discuss any questions you have with your health care provider. °  °Document   Released: 08/23/2000 Document Revised: 06/07/2014 Document Reviewed: 09/15/2011 °Elsevier Interactive Patient Education ©2016 [employer] ° °

## 2015-10-15 ENCOUNTER — Ambulatory Visit: Payer: BLUE CROSS/BLUE SHIELD | Admitting: Anesthesiology

## 2015-10-15 ENCOUNTER — Encounter
Admission: RE | Disposition: A | Discharge status: Home / Self Care | Payer: Self-pay | Source: Ambulatory Visit | Attending: Otolaryngology

## 2015-10-15 ENCOUNTER — Ambulatory Visit
Admission: RE | Admit: 2015-10-15 | Discharge: 2015-10-15 | Disposition: A | Discharge status: Home / Self Care | Payer: BLUE CROSS/BLUE SHIELD | Source: Ambulatory Visit | Attending: Otolaryngology | Admitting: Otolaryngology

## 2015-10-15 ENCOUNTER — Encounter: Payer: Self-pay | Admitting: Otolaryngology

## 2015-10-15 DIAGNOSIS — I10 Essential (primary) hypertension: Secondary | ICD-10-CM | POA: Insufficient documentation

## 2015-10-15 DIAGNOSIS — Z888 Allergy status to other drugs, medicaments and biological substances status: Secondary | ICD-10-CM | POA: Diagnosis not present

## 2015-10-15 DIAGNOSIS — J351 Hypertrophy of tonsils: Secondary | ICD-10-CM | POA: Diagnosis not present

## 2015-10-15 DIAGNOSIS — Z9049 Acquired absence of other specified parts of digestive tract: Secondary | ICD-10-CM | POA: Diagnosis not present

## 2015-10-15 DIAGNOSIS — Z79899 Other long term (current) drug therapy: Secondary | ICD-10-CM | POA: Diagnosis not present

## 2015-10-15 DIAGNOSIS — J3501 Chronic tonsillitis: Secondary | ICD-10-CM | POA: Diagnosis not present

## 2015-10-15 DIAGNOSIS — A4289 Other forms of actinomycosis: Secondary | ICD-10-CM | POA: Diagnosis not present

## 2015-10-15 HISTORY — DX: Hypertrophy of tonsils: J35.1

## 2015-10-15 HISTORY — PX: TONSILLECTOMY AND ADENOIDECTOMY: SHX28

## 2015-10-15 HISTORY — DX: Acute tonsillitis, unspecified: J03.90

## 2015-10-15 SURGERY — TONSILLECTOMY AND ADENOIDECTOMY
Anesthesia: General | Site: Throat | Wound class: Clean Contaminated

## 2015-10-15 MED ORDER — SUCCINYLCHOLINE CHLORIDE 20 MG/ML IJ SOLN
INTRAMUSCULAR | Status: DC | PRN
  Administered 2015-10-15: 100 mg via INTRAVENOUS

## 2015-10-15 MED ORDER — LACTATED RINGERS IV SOLN
INTRAVENOUS | Status: DC
  Administered 2015-10-15: 08:00:00 via INTRAVENOUS

## 2015-10-15 MED ORDER — DEXAMETHASONE SODIUM PHOSPHATE 4 MG/ML IJ SOLN
INTRAMUSCULAR | Status: DC | PRN
  Administered 2015-10-15: 8 mg via INTRAVENOUS

## 2015-10-15 MED ORDER — OXYCODONE HCL 5 MG PO TABS: 5.0000 mg | ORAL_TABLET | Freq: Once | ORAL | Status: AC | PRN

## 2015-10-15 MED ORDER — ONDANSETRON 4 MG PO TBDP
4.0000 mg | ORAL_TABLET | Freq: Once | ORAL | Status: AC
  Administered 2015-10-15: 4 mg via ORAL

## 2015-10-15 MED ORDER — FENTANYL CITRATE (PF) 100 MCG/2ML IJ SOLN
25.0000 ug | INTRAMUSCULAR | Status: DC | PRN
  Administered 2015-10-15: 25 ug via INTRAVENOUS
  Administered 2015-10-15: 100 ug via INTRAVENOUS
  Administered 2015-10-15 (×2): 25 ug via INTRAVENOUS

## 2015-10-15 MED ORDER — OXYMETAZOLINE HCL 0.05 % NA SOLN
NASAL | Status: DC | PRN
  Administered 2015-10-15: 1 via TOPICAL

## 2015-10-15 MED ORDER — LIDOCAINE HCL (CARDIAC) 20 MG/ML IV SOLN
INTRAVENOUS | Status: DC | PRN
  Administered 2015-10-15: 30 mg via INTRAVENOUS

## 2015-10-15 MED ORDER — KETOROLAC TROMETHAMINE 30 MG/ML IJ SOLN: 30.0000 mg | Freq: Once | INTRAMUSCULAR | Status: DC

## 2015-10-15 MED ORDER — BUPIVACAINE HCL (PF) 0.25 % IJ SOLN
INTRAMUSCULAR | Status: DC | PRN
  Administered 2015-10-15: 2 mL

## 2015-10-15 MED ORDER — OXYCODONE HCL 5 MG/5ML PO SOLN
5.0000 mg | Freq: Once | ORAL | Status: AC | PRN
  Administered 2015-10-15: 5 mg via ORAL

## 2015-10-15 MED ORDER — ONDANSETRON HCL 4 MG/2ML IJ SOLN
4.0000 mg | Freq: Once | INTRAMUSCULAR | Status: AC | PRN
  Administered 2015-10-15: 4 mg via INTRAVENOUS

## 2015-10-15 MED ORDER — PROPOFOL 10 MG/ML IV BOLUS
INTRAVENOUS | Status: DC | PRN
  Administered 2015-10-15: 20 mg via INTRAVENOUS
  Administered 2015-10-15: 200 mg via INTRAVENOUS
  Administered 2015-10-15: 20 mg via INTRAVENOUS

## 2015-10-15 MED ORDER — MIDAZOLAM HCL 5 MG/5ML IJ SOLN
INTRAMUSCULAR | Status: DC | PRN
  Administered 2015-10-15: 2 mg via INTRAVENOUS

## 2015-10-15 SURGICAL SUPPLY — 17 items
BLADE BOVIE TIP EXT 4 (BLADE) ×2 IMPLANT
CANISTER SUCT 1200ML W/VALVE (MISCELLANEOUS) ×2 IMPLANT
CATH ROBINSON RED A/P 10FR (CATHETERS) ×2 IMPLANT
COAG SUCT 10F 3.5MM HAND CTRL (MISCELLANEOUS) ×2 IMPLANT
GLOVE BIO SURGEON STRL SZ7.5 (GLOVE) ×4 IMPLANT
HANDLE SUCTION POOLE (INSTRUMENTS) ×1 IMPLANT
KIT ROOM TURNOVER OR (KITS) ×2 IMPLANT
NEEDLE HYPO 25GX1X1/2 BEV (NEEDLE) ×2 IMPLANT
NS IRRIG 500ML POUR BTL (IV SOLUTION) ×2 IMPLANT
PACK TONSIL/ADENOIDS (PACKS) ×2 IMPLANT
PAD GROUND ADULT SPLIT (MISCELLANEOUS) ×2 IMPLANT
PENCIL ELECTRO HAND CTR (MISCELLANEOUS) ×2 IMPLANT
SOL ANTI-FOG 6CC FOG-OUT (MISCELLANEOUS) ×1 IMPLANT
SOL FOG-OUT ANTI-FOG 6CC (MISCELLANEOUS) ×1
STRAP BODY AND KNEE 60X3 (MISCELLANEOUS) ×2 IMPLANT
SUCTION POOLE HANDLE (INSTRUMENTS) ×2
SYR 5ML LL (SYRINGE) ×2 IMPLANT

## 2015-10-15 NOTE — Op Note (Signed)
..  10/15/2015  9:03 AM    [name]  [id number]   Pre-Op Dx:  CHRONIC TONSIL  Post-op Dx: CHRONIC TONSIL  Proc:Tonsillectomy  > age 35  Surg: Paco Cislo  Anes:  General Endotracheal  EBL:  10  Comp:  None  Findings:  Intratonsillar abscess on patient's right, significant fibrotic scar on patient's left side.  Procedure: After the patient was identified in holding and the history and physical and consent was reviewed, the patient was taken to the operating room and placed in a supine position.  General endotracheal anesthesia was induced in the normal fashion.  At this time, the patient was rotated 45 degrees and a shoulder roll was placed.  At this time, a McIvor mouthgag was inserted into the patient's oral cavity and suspended from the Lambert stand without injury to teeth, lips, or gums.  Next a red rubber catheter was inserted into the patient left nostril for retraction of the uvula and soft palate superiorly.  Next a curved Alice clamp was attached to the patient's right superior tonsillar pole and retracted medially and inferiorly.  A Bovie electrocautery was used to dissect the patient's right tonsil in a subcapsular plane.  Meticulous hemostasis was achieved with Bovie suction cautery.  At this time, the mouth gag was released from suspension for 1 minute.  Attention now was directed to the patient's left side.  In a similar fashion the curved Alice clamp was attached to the superior pole and this was retracted medially and inferiorly and the tonsil was excised in a subcapsular plane with Bovie electrocautery.  After completion of the second tonsil, meticulous hemostasis was continued.  At this time, the patient's nasal cavity and oral cavity was irrigated with sterile saline.  2cc of 0.25% Marcaine was injected into the anterior and posterior tonsillar fossa bilaterally.  Following this  The care of patient was returned to anesthesia, awakened, and transferred to  recovery in stable condition.  Dispo:  PACU to home  Plan: Soft diet.  Limit exercise and strenuous activity for 2 weeks.  Fluid hydration  Recheck my office three weeks.   Antasia Haider 9:03 AM 10/15/2015

## 2015-10-15 NOTE — Anesthesia Postprocedure Evaluation (Signed)
Anesthesia Post Note  Patient: Kelsey Barnes  Procedure(s) Performed: Procedure(s) (LRB): TONSILLECTOMY  (N/A)  Patient location during evaluation: PACU Anesthesia Type: General Level of consciousness: awake and alert Pain management: pain level controlled Vital Signs Assessment: post-procedure vital signs reviewed and stable Respiratory status: spontaneous breathing, nonlabored ventilation, respiratory function stable and patient connected to nasal cannula oxygen Cardiovascular status: blood pressure returned to baseline and stable Postop Assessment: no signs of nausea or vomiting Anesthetic complications: no    Amaryllis Dyke

## 2015-10-15 NOTE — Anesthesia Procedure Notes (Signed)
Procedure Name: Intubation Date/Time: 10/15/2015 8:34 AM Performed by: Londell Moh Pre-anesthesia Checklist: Patient identified, Emergency Drugs available, Suction available, Patient being monitored and Timeout performed Patient Re-evaluated:Patient Re-evaluated prior to inductionOxygen Delivery Method: Circle system utilized Preoxygenation: Pre-oxygenation with 100% oxygen Intubation Type: IV induction Ventilation: Mask ventilation without difficulty Laryngoscope Size: Mac and 3 Grade View: Grade I Tube type: Oral Rae Tube size: 7.0 mm Number of attempts: 1 Airway Equipment and Method: Stylet Placement Confirmation: ETT inserted through vocal cords under direct vision,  positive ETCO2 and breath sounds checked- equal and bilateral Tube secured with: Tape Dental Injury: Teeth and Oropharynx as per pre-operative assessment

## 2015-10-15 NOTE — Transfer of Care (Signed)
Immediate Anesthesia Transfer of Care Note  Patient: Kelsey Barnes  Procedure(s) Performed: Procedure(s) with comments: TONSILLECTOMY  (N/A) - UPREG  Patient Location: PACU  Anesthesia Type: General  Level of Consciousness: awake, alert  and patient cooperative  Airway and Oxygen Therapy: Patient Spontanous Breathing and Patient connected to supplemental oxygen  Post-op Assessment: Post-op Vital signs reviewed, Patient's Cardiovascular Status Stable, Respiratory Function Stable, Patent Airway and No signs of Nausea or vomiting  Post-op Vital Signs: Reviewed and stable  Complications: No apparent anesthesia complications

## 2015-10-15 NOTE — H&P (Signed)
..  History and Physical paper copy reviewed and updated date of procedure and will be scanned into system.  

## 2015-10-15 NOTE — Anesthesia Preprocedure Evaluation (Signed)
Anesthesia Evaluation  Patient identified by MRN, date of birth, ID band Patient awake    Reviewed: Allergy & Precautions, NPO status , Patient's Chart, lab work & pertinent test results  Airway Mallampati: II  TM Distance: >3 FB Neck ROM: full    Dental   Pulmonary    Pulmonary exam normal        Cardiovascular hypertension, Normal cardiovascular exam     Neuro/Psych    GI/Hepatic   Endo/Other    Renal/GU      Musculoskeletal   Abdominal   Peds  Hematology   Anesthesia Other Findings   Reproductive/Obstetrics                             Anesthesia Physical Anesthesia Plan  ASA: II  Anesthesia Plan: General   Post-op Pain Management:    Induction:   Airway Management Planned:   Additional Equipment:   Intra-op Plan:   Post-operative Plan:   Informed Consent: I have reviewed the patients History and Physical, chart, labs and discussed the procedure including the risks, benefits and alternatives for the proposed anesthesia with the patient or authorized representative who has indicated his/her understanding and acceptance.     Plan Discussed with: CRNA  Anesthesia Plan Comments:         Anesthesia Quick Evaluation

## 2015-10-15 NOTE — Addendum Note (Signed)
Addendum  created 10/15/15 [id number] by Amaryllis Dyke, MD   Modules edited: Orders

## 2015-10-16 ENCOUNTER — Encounter: Payer: Self-pay | Admitting: Otolaryngology

## 2015-10-17 ENCOUNTER — Ambulatory Visit: Payer: BLUE CROSS/BLUE SHIELD | Admitting: Family Medicine

## 2015-10-17 LAB — SURGICAL PATHOLOGY

## 2015-12-03 ENCOUNTER — Ambulatory Visit (INDEPENDENT_AMBULATORY_CARE_PROVIDER_SITE_OTHER): Payer: BLUE CROSS/BLUE SHIELD | Admitting: Family Medicine

## 2015-12-03 ENCOUNTER — Encounter: Payer: Self-pay | Admitting: Family Medicine

## 2015-12-03 VITALS — BP 126/84 | HR 76 | Temp 98.8°F | Resp 16 | Ht 67.0 in | Wt 244.1 lb

## 2015-12-03 DIAGNOSIS — A609 Anogenital herpesviral infection, unspecified: Secondary | ICD-10-CM

## 2015-12-03 DIAGNOSIS — E559 Vitamin D deficiency, unspecified: Secondary | ICD-10-CM | POA: Diagnosis not present

## 2015-12-03 DIAGNOSIS — J300 Vasomotor rhinitis: Secondary | ICD-10-CM | POA: Diagnosis not present

## 2015-12-03 DIAGNOSIS — J3089 Other allergic rhinitis: Secondary | ICD-10-CM

## 2015-12-03 DIAGNOSIS — E049 Nontoxic goiter, unspecified: Secondary | ICD-10-CM | POA: Diagnosis not present

## 2015-12-03 DIAGNOSIS — J309 Allergic rhinitis, unspecified: Secondary | ICD-10-CM

## 2015-12-03 DIAGNOSIS — E785 Hyperlipidemia, unspecified: Secondary | ICD-10-CM | POA: Diagnosis not present

## 2015-12-03 DIAGNOSIS — A6009 Herpesviral infection of other urogenital tract: Secondary | ICD-10-CM

## 2015-12-03 DIAGNOSIS — E669 Obesity, unspecified: Secondary | ICD-10-CM | POA: Diagnosis not present

## 2015-12-03 DIAGNOSIS — I10 Essential (primary) hypertension: Secondary | ICD-10-CM

## 2015-12-03 MED ORDER — NEBIVOLOL HCL 20 MG PO TABS: 10.0000 mg | ORAL_TABLET | Freq: Every day | ORAL | Status: DC

## 2015-12-03 MED ORDER — AZELASTINE-FLUTICASONE 137-50 MCG/ACT NA SUSP: 1.0000 | Freq: Two times a day (BID) | NASAL | Status: DC

## 2015-12-03 MED ORDER — VALACYCLOVIR HCL 500 MG PO TABS: 500.0000 mg | ORAL_TABLET | Freq: Every day | ORAL | Status: DC

## 2015-12-03 NOTE — Progress Notes (Signed)
Name: Kelsey Barnes   MRN: [id number]    DOB: [birth year]   Date:12/03/2015       Progress Note  Subjective  Chief Complaint  Chief Complaint  Patient presents with  . Medication Refill    6 month F/U  . Hypertension  . Allergic Rhinitis     Patient states the Zytrec is not helping her sneezing and runny nose, would like to know if she could double up on medication and switch Nose Spray.     HPI  HTN: taking medication, denies side effects, however she is very concerned about angioedema and wants to change medication. Discussed options and we will try switching from lisinopril/hctz and Atenolol to Bysltolic, no chest pain or palpitation.   Obesity: she stopped juicing or exercising now. She had a tonsillectomy in May and has been eating whatever she wants since. She has gained over 20 lbs since. Discussed importance of resuming life style modification  Herpes Genitalis: no outbreaks for over one year, doing well on prophylactic medication   Vasomotor/AR rhinitis: she states when she eats, exercises or temperature change her nose runs water, no congestion. She also has year round symptoms of itchy nose and sneezing.   Sacral pain: she fell while in high school, and slipped on water and fell on basketball court and somebody else fell behind her and hit her with his foot on the same spot. She has intermittent pain on the sacrum, described as sharp pain, mild when sitting but has pain when first stands up - feels a release of pressure. She is using a cushion when sitting and is doing a little better. She states she had X_rays in the past   Patient Active Problem List   Diagnosis Date Noted  . Vitamin D deficiency 12/13/2014  . Perennial allergic rhinitis 12/13/2014  . Metabolic syndrome [id number]  . Herpes genitalis in women 12/13/2014  . Goiter 12/13/2014  . Dyslipidemia 12/13/2014  . Hematuria, microscopic 12/13/2014  . Hypertension 09/06/2013  . Obesity (BMI 30-39.9)  09/06/2013  . H/O myomectomy 05/14/2013    Past Surgical History  Procedure Laterality Date  . Appendectomy    . Myomectomy  12.19.2014  . Cholecystectomy    . Tonsillectomy and adenoidectomy N/A 10/15/2015    Procedure: TONSILLECTOMY ;  Surgeon: Carloyn Manner, MD;  Location: [facility];  Service: ENT;  Laterality: N/A;  UPREG    Family History  Problem Relation Age of Onset  . Hypertension Mother   . Diabetes Mother   . COPD Mother   . Stroke Mother   . Hypertension Father   . Heart attack Father     Social History   Social History  . Marital Status: Single    Spouse Name: N/A  . Number of Children: N/A  . Years of Education: N/A   Occupational History  . Not on file.   Social History Main Topics  . Smoking status: Passive Smoke Exposure - Never Smoker  . Smokeless tobacco: Never Used  . Alcohol Use: 0.0 oz/week    0 Standard drinks or equivalent per week     Comment: occasionally  . Drug Use: No  . Sexual Activity: No   Other Topics Concern  . Not on file   Social History Narrative     Current outpatient prescriptions:  .  cetirizine (ZYRTEC) 10 MG tablet, Take 10 mg by mouth 2 (two) times daily. Am and pm, Disp: , Rfl:  .  NORINYL 1+35, 28, tablet, Take  1 tablet by mouth daily. (Patient taking differently: Take 1 tablet by mouth daily. Am), Disp: 84 tablet, Rfl: 4 .  valACYclovir (VALTREX) 500 MG tablet, Take 1 tablet (500 mg total) by mouth daily. And three times daily prn outbreak, Disp: 100 tablet, Rfl: 4 .  Azelastine-Fluticasone (DYMISTA) 137-50 MCG/ACT SUSP, Place 1 spray into the nose 2 (two) times daily., Disp: 23 g, Rfl: 2 .  Nebivolol HCl 20 MG TABS, Take 0.5-1 tablets (10-20 mg total) by mouth daily., Disp: 30 tablet, Rfl: 0  Allergies  Allergen Reactions  . Percocet [Oxycodone-Acetaminophen] Itching and Other (See Comments)    Hot Flashes     ROS  Constitutional: Negative for fever, positive for weight change.  Respiratory:  Negative for cough and shortness of breath.   Cardiovascular: Negative for chest pain or palpitations.  Gastrointestinal: Negative for abdominal pain, no bowel changes.  Musculoskeletal: Negative for gait problem or joint swelling.  Skin: Negative for rash.  Neurological: Negative for dizziness or headache.  No other specific complaints in a complete review of systems (except as listed in HPI above).  Objective  Filed Vitals:   12/03/15 1339  BP: 126/84  Pulse: 76  Temp: 98.8 F (37.1 C)  TempSrc: Oral  Resp: 16  Height: 5\' 7"  (1.702 m)  Weight: 244 lb 1.6 oz (110.723 kg)  SpO2: 96%    Body mass index is 38.22 kg/(m^2).  Physical Exam  Constitutional: Patient appears well-developed and well-nourished. Obese  No distress.  HEENT: head atraumatic, normocephalic, pupils equal and reactive to light,  neck supple, throat within normal limits, boggy turbinates, goiter Cardiovascular: Normal rate, regular rhythm and normal heart sounds.  No murmur heard. No BLE edema. Pulmonary/Chest: Effort normal and breath sounds normal. No respiratory distress. Abdominal: Soft.  There is no tenderness. Psychiatric: Patient has a normal mood and affect. behavior is normal. Judgment and thought content normal.  Recent Results (from the past 2160 hour(s))  Surgical pathology     Status: None   Collection Time: 10/15/15  8:47 AM  Result Value Ref Range   SURGICAL PATHOLOGY      Surgical Pathology CASE: [id number] PATIENT: Kelsey Barnes Surgical Pathology Report     SPECIMEN SUBMITTED: A. Tonsil, right B. Tonsil, left  CLINICAL HISTORY: None provided  PRE-OPERATIVE DIAGNOSIS: Chronic tonsil  POST-OPERATIVE DIAGNOSIS: Chronic tonsillitis     DIAGNOSIS: A. RIGHT TONSIL; TONSILLECTOMY: - TONSILLAR TISSUE WITH LYMPHOID HYPERPLASIA AND CRYPTITIS. - ORGANISMS MORPHOLOGICALLY CONSISTENT WITH ACTINOMYCES PRESENT.  B. LEFT TONSIL; TONSILLECTOMY: - TONSILLAR TISSUE WITH  LYMPHOID HYPERPLASIA.   GROSS DESCRIPTION:  A. Labeled: right tonsil  Size: 2.8 x 2.0 x 1.5 cm  Epithelial surface: cerebriform pink-tan  Cut surface: cerebriform pink-tan with focal yellow friable material  Other findings: none noted  Block summary: 1- representative section(s)   B. Labeled: left tonsil  Size: 2.6 x 1.1 x 1.0 cm  Epithelial surface: cerebriform pink-tan  Cut surface: with focal yellow friable material  Other findings: none  noted  Block summary: 1- representative section(s)    Final Diagnosis performed by Delorse Lek, MD.  Electronically signed 10/17/2015 4:29:11PM    The electronic signature indicates that the named Attending Pathologist has evaluated the specimen  Technical component performed at [facility], [address], [place], [place] [address] Lab: [phone] Dir: Darrick Penna. Evette Doffing, MD  Professional component performed at [facility], [facility], [place], [place], [place] [address] Lab: [phone] Dir: Dellia Nims. Reuel Derby, MD  PHQ2/9: Depression screen [facility] 2/9 12/03/2015 07/17/2015 04/18/2015 12/13/2014  Decreased Interest 0 0 0 0  Down, Depressed, Hopeless 0 0 0 0  PHQ - 2 Score 0 0 0 0     Fall Risk: Fall Risk  12/03/2015 07/17/2015 04/18/2015 12/13/2014  Falls in the past year? No No No No     Functional Status Survey: Is the patient deaf or have difficulty hearing?: No Does the patient have difficulty seeing, even when wearing glasses/contacts?: No Does the patient have difficulty concentrating, remembering, or making decisions?: No Does the patient have difficulty walking or climbing stairs?: No Does the patient have difficulty dressing or bathing?: No Does the patient have difficulty doing errands alone such as visiting a doctor's office or shopping?: No    Assessment & Plan  1. Essential hypertension  - Nebivolol HCl 20 MG TABS; Take 0.5-1 tablets (10-20 mg total) by mouth daily.   Dispense: 30 tablet; Refill: 0  2. Dyslipidemia  Discussed life style modification  3. Vitamin D deficiency  Continue otc supplementation   4. Vasomotor rhinitis  - Azelastine-Fluticasone (DYMISTA) 137-50 MCG/ACT SUSP; Place 1 spray into the nose 2 (two) times daily.  Dispense: 23 g; Refill: 2  5. Herpes genitalis in women  - valACYclovir (VALTREX) 500 MG tablet; Take 1 tablet (500 mg total) by mouth daily. And three times daily prn outbreak  Dispense: 100 tablet; Refill: 4  6. Perennial allergic rhinitis  - Azelastine-Fluticasone (DYMISTA) 137-50 MCG/ACT SUSP; Place 1 spray into the nose 2 (two) times daily.  Dispense: 23 g; Refill: 2  7. Obesity (BMI 30-39.9)  Discussed with the patient the risk posed by an increased BMI. Discussed importance of portion control, calorie counting and at least 150 minutes of physical activity weekly. Avoid sweet beverages and drink more water. Eat at least 6 servings of fruit and vegetables daily   8. Goiter  Seen by Dr. Ronnald Collum - had imaging

## 2015-12-11 ENCOUNTER — Telehealth: Payer: Self-pay | Admitting: Family Medicine

## 2015-12-11 NOTE — Telephone Encounter (Signed)
States that bystolic was expensive and is giving 3 alternatives -Metoprolol (tartrate or susanate) -Bisoprolol -Carvedilol  Also states that Dymista would be less expensive if you splite the prescription -Azelastine with Fluticasone or Flunisolide

## 2015-12-12 MED ORDER — CARVEDILOL 12.5 MG PO TABS: 12.5000 mg | ORAL_TABLET | Freq: Two times a day (BID) | ORAL | Status: DC

## 2015-12-12 MED ORDER — AZELASTINE HCL 0.1 % NA SOLN: 2.0000 | Freq: Two times a day (BID) | NASAL | Status: DC

## 2015-12-12 MED ORDER — FLUTICASONE PROPIONATE 50 MCG/ACT NA SUSP: 2.0000 | Freq: Every day | NASAL | Status: DC

## 2015-12-12 NOTE — Telephone Encounter (Signed)
Done, [facility]

## 2016-01-12 ENCOUNTER — Telehealth: Payer: Self-pay | Admitting: Family Medicine

## 2016-01-12 NOTE — Telephone Encounter (Signed)
Can she come in for follow up? I can also refer her to nephrologist if she prefers, to try other options.

## 2016-01-13 NOTE — Telephone Encounter (Signed)
Pt has an appt on 01/16/2016 for CPE

## 2016-01-16 ENCOUNTER — Encounter: Payer: BLUE CROSS/BLUE SHIELD | Admitting: Family Medicine

## 2016-02-29 ENCOUNTER — Other Ambulatory Visit: Payer: Self-pay | Admitting: Family Medicine

## 2016-02-29 DIAGNOSIS — N946 Dysmenorrhea, unspecified: Secondary | ICD-10-CM

## 2016-03-05 ENCOUNTER — Ambulatory Visit: Payer: BLUE CROSS/BLUE SHIELD | Admitting: Family Medicine

## 2016-03-26 ENCOUNTER — Other Ambulatory Visit: Payer: Self-pay | Admitting: Family Medicine

## 2016-03-26 NOTE — Telephone Encounter (Signed)
Patient is scheduled to see Dr. Ancil Boozer on 04/13/16 @ 10:40am, but she is out of carvedilol 12.5mg  and patient is wanted to know if she could get enough until her appt.  Patient uses the [facility] on [address].

## 2016-03-28 MED ORDER — CARVEDILOL 12.5 MG PO TABS: 12.5000 mg | ORAL_TABLET | Freq: Two times a day (BID) | ORAL | 0 refills | Status: DC

## 2016-03-28 NOTE — Telephone Encounter (Signed)
Last BP and pulse reviewed; Rx approved 

## 2016-04-13 ENCOUNTER — Ambulatory Visit: Payer: BLUE CROSS/BLUE SHIELD | Admitting: Family Medicine

## 2016-04-16 ENCOUNTER — Ambulatory Visit (INDEPENDENT_AMBULATORY_CARE_PROVIDER_SITE_OTHER): Payer: BLUE CROSS/BLUE SHIELD | Admitting: Family Medicine

## 2016-04-16 ENCOUNTER — Encounter: Payer: Self-pay | Admitting: Family Medicine

## 2016-04-16 VITALS — BP 142/86 | HR 84 | Temp 99.1°F | Resp 16 | Ht 67.0 in | Wt 252.4 lb

## 2016-04-16 DIAGNOSIS — J300 Vasomotor rhinitis: Secondary | ICD-10-CM

## 2016-04-16 DIAGNOSIS — Z23 Encounter for immunization: Secondary | ICD-10-CM

## 2016-04-16 DIAGNOSIS — E559 Vitamin D deficiency, unspecified: Secondary | ICD-10-CM

## 2016-04-16 DIAGNOSIS — I10 Essential (primary) hypertension: Secondary | ICD-10-CM | POA: Diagnosis not present

## 2016-04-16 DIAGNOSIS — E785 Hyperlipidemia, unspecified: Secondary | ICD-10-CM

## 2016-04-16 DIAGNOSIS — Z131 Encounter for screening for diabetes mellitus: Secondary | ICD-10-CM

## 2016-04-16 DIAGNOSIS — E669 Obesity, unspecified: Secondary | ICD-10-CM

## 2016-04-16 DIAGNOSIS — A6009 Herpesviral infection of other urogenital tract: Secondary | ICD-10-CM

## 2016-04-16 DIAGNOSIS — R5383 Other fatigue: Secondary | ICD-10-CM | POA: Diagnosis not present

## 2016-04-16 DIAGNOSIS — J3089 Other allergic rhinitis: Secondary | ICD-10-CM | POA: Diagnosis not present

## 2016-04-16 MED ORDER — TRIAMTERENE-HCTZ 37.5-25 MG PO CAPS: 1.0000 | ORAL_CAPSULE | Freq: Every day | ORAL | 2 refills | Status: DC

## 2016-04-16 MED ORDER — CARVEDILOL 6.25 MG PO TABS: 6.2500 mg | ORAL_TABLET | Freq: Two times a day (BID) | ORAL | 2 refills | Status: DC

## 2016-04-16 NOTE — Progress Notes (Signed)
Name: Kelsey Barnes   MRN: [id number]    DOB: 04-26-1981   Date:04/16/2016       Progress Note  Subjective  Chief Complaint  Chief Complaint  Patient presents with  . Hypertension  . Medication Refill    HPI  HTN: taking medication, denies side effects, however she is very concerned about angioedema and wants to change medication. She has been on carvedilol and off ace because of angioedema. BP is not at goal we will switch to Triamterene/hctz.   Obesity: she stopped juicing or exercising now. She had a tonsillectomy in May. She is working two jobs and about 13 hours per day. She is skipping breakfast, she eats fast food very often also. She is going to try packing her meals.   Herpes Genitalis: no outbreaks for over one year, doing well on prophylactic medication   Vasomotor/AR rhinitis: she states when she eats, exercises or temperature change her nose runs water, no congestion. She also has year round symptoms of itchy nose and sneezing. Discussed referral to allergist   Sacral pain: she fell while in high school, and slipped on water and fell on basketball court and somebody else fell behind her and hit her with his foot on the same spot. She has intermittent pain on the sacrum, described as sharp pain, mild when sitting but has pain when first stands up - feels a release of pressure. She is using a cushion when sitting and is doing much better. She states she had X_rays in the past. Pain is now a little higher, right lower back and radiates to right outer hip. It gets stiff when she sits down for a long time, described as dull aching pain  Metabolic syndrome: she has acanthosis nigricans, she denies polydipsia, polyuria or polyphagia  Dyslipidemia: due for labs, not following a healthy diet at this time     Patient Active Problem List   Diagnosis Date Noted  . Vitamin D deficiency 12/13/2014  . Perennial allergic rhinitis 12/13/2014  . Metabolic syndrome [id number]   . Herpes genitalis in women 12/13/2014  . Goiter 12/13/2014  . Dyslipidemia 12/13/2014  . Hematuria, microscopic 12/13/2014  . Hypertension 09/06/2013  . Obesity (BMI 30-39.9) 09/06/2013  . H/O myomectomy 05/14/2013    Past Surgical History:  Procedure Laterality Date  . APPENDECTOMY    . CHOLECYSTECTOMY    . MYOMECTOMY  12.19.2014  . TONSILLECTOMY AND ADENOIDECTOMY N/A 10/15/2015   Procedure: TONSILLECTOMY ;  Surgeon: Carloyn Manner, MD;  Location: [facility];  Service: ENT;  Laterality: N/A;  UPREG    Family History  Problem Relation Age of Onset  . Hypertension Mother   . Diabetes Mother   . COPD Mother   . Stroke Mother   . Hypertension Father   . Heart attack Father     Social History   Social History  . Marital status: Single    Spouse name: N/A  . Number of children: N/A  . Years of education: N/A   Occupational History  . Not on file.   Social History Main Topics  . Smoking status: Passive Smoke Exposure - Never Smoker  . Smokeless tobacco: Never Used  . Alcohol use 0.0 oz/week     Comment: occasionally  . Drug use: No  . Sexual activity: No   Other Topics Concern  . Not on file   Social History Narrative  . No narrative on file     Current Outpatient Prescriptions:  .  azelastine (  ASTELIN) 0.1 % nasal spray, Place 2 sprays into both nostrils 2 (two) times daily. Use in each nostril as directed, Disp: 90 mL, Rfl: 1 .  carvedilol (COREG) 6.25 MG tablet, Take 1 tablet (6.25 mg total) by mouth 2 (two) times daily with a meal., Disp: 60 tablet, Rfl: 2 .  cetirizine (ZYRTEC) 10 MG tablet, Take 10 mg by mouth 2 (two) times daily. Am and pm, Disp: , Rfl:  .  fluticasone (FLONASE) 50 MCG/ACT nasal spray, Place 2 sprays into both nostrils daily., Disp: 48 g, Rfl: 0 .  NORINYL 1+35, 28, tablet, TAKE 1 TABLET DAILY, Disp: 84 tablet, Rfl: 4 .  triamterene-hydrochlorothiazide (DYAZIDE) 37.5-25 MG capsule, Take 1 each (1 capsule total) by mouth  daily., Disp: 30 capsule, Rfl: 2 .  valACYclovir (VALTREX) 500 MG tablet, Take 1 tablet (500 mg total) by mouth daily. And three times daily prn outbreak, Disp: 100 tablet, Rfl: 4  Allergies  Allergen Reactions  . Percocet [Oxycodone-Acetaminophen] Itching and Other (See Comments)    Hot Flashes     ROS  Constitutional: Negative for fever, positive for  weight change.  Respiratory: Negative for cough and shortness of breath.   Cardiovascular: Negative for chest pain or palpitations.  Gastrointestinal: Negative for abdominal pain, no bowel changes.  Musculoskeletal: Negative for gait problem or joint swelling.  Skin: Negative for rash.  Neurological: Negative for dizziness, positive for mild dull aching  Headache intermittent, not at this time.  No other specific complaints in a complete review of systems (except as listed in HPI above).  Objective  Vitals:   04/16/16 1526 04/16/16 1554  BP: (!) 158/88 (!) 142/86  Pulse: 84   Resp: 16   Temp: 99.1 F (37.3 C)   SpO2: 95%   Weight: 252 lb 7 oz (114.5 kg)   Height: 5\' 7"  (1.702 m)     Body mass index is 39.54 kg/m.  Physical Exam Constitutional: Patient appears well-developed and well-nourished. Obese  No distress.  HEENT: head atraumatic, normocephalic, pupils equal and reactive to light, neck supple, throat within normal limits, goiter Cardiovascular: Normal rate, regular rhythm and normal heart sounds.  No murmur heard. No BLE edema. Pulmonary/Chest: Effort normal and breath sounds normal. No respiratory distress. Abdominal: Soft.  There is no tenderness. Psychiatric: Patient has a normal mood and affect. behavior is normal. Judgment and thought content normal.  PHQ2/9: Depression screen [facility] 2/9 12/03/2015 07/17/2015 04/18/2015 12/13/2014  Decreased Interest 0 0 0 0  Down, Depressed, Hopeless 0 0 0 0  PHQ - 2 Score 0 0 0 0     Fall Risk: Fall Risk  12/03/2015 07/17/2015 04/18/2015 12/13/2014  Falls in the past year? No  No No No     Assessment & Plan  1. Essential hypertension  - triamterene-hydrochlorothiazide (DYAZIDE) 37.5-25 MG capsule; Take 1 each (1 capsule total) by mouth daily.  Dispense: 30 capsule; Refill: 2 - carvedilol (COREG) 6.25 MG tablet; Take 1 tablet (6.25 mg total) by mouth 2 (two) times daily with a meal.  Dispense: 60 tablet; Refill: 2 - CBC with Differential/Platelet - COMPLETE METABOLIC PANEL WITH GFR  2. Need for influenza vaccination  - Flu Vaccine QUAD 36+ mos PF IM (Fluarix & Fluzone Quad PF)  3. Dyslipidemia  - Lipid panel  4. Obesity (BMI 30-39.9)  Discussed with the patient the risk posed by an increased BMI. Discussed importance of portion control, calorie counting and at least 150 minutes of physical activity weekly. Avoid sweet beverages  and drink more water. Eat at least 6 servings of fruit and vegetables daily   5. Diabetes mellitus screening  - Hemoglobin A1c  6. Other fatigue  - VITAMIN D 25 Hydroxy (Vit-D Deficiency, Fractures) - Vitamin B12 - TSH   7. Perennial allergic rhinitis  Discussed referral to allergist but she would like to hold off  8. Chronic vasomotor rhinitis   9. Herpes genitalis in women  No recent outbreaks  10. Vitamin D deficiency  -vitamin D

## 2016-05-05 ENCOUNTER — Other Ambulatory Visit: Payer: Self-pay | Admitting: Family Medicine

## 2016-05-05 DIAGNOSIS — Z131 Encounter for screening for diabetes mellitus: Secondary | ICD-10-CM | POA: Diagnosis not present

## 2016-05-05 DIAGNOSIS — I10 Essential (primary) hypertension: Secondary | ICD-10-CM | POA: Diagnosis not present

## 2016-05-05 DIAGNOSIS — R5383 Other fatigue: Secondary | ICD-10-CM | POA: Diagnosis not present

## 2016-05-05 DIAGNOSIS — E785 Hyperlipidemia, unspecified: Secondary | ICD-10-CM | POA: Diagnosis not present

## 2016-05-05 LAB — COMPLETE METABOLIC PANEL WITH GFR
ALT: 19 U/L (ref 6–29)
AST: 20 U/L (ref 10–30)
Albumin: 4.2 g/dL (ref 3.6–5.1)
Alkaline Phosphatase: 48 U/L (ref 33–115)
BILIRUBIN TOTAL: 0.9 mg/dL (ref 0.2–1.2)
BUN: 12 mg/dL (ref 7–25)
CALCIUM: 9.3 mg/dL (ref 8.6–10.2)
CHLORIDE: 101 mmol/L (ref 98–110)
CO2: 24 mmol/L (ref 20–31)
CREATININE: 0.87 mg/dL (ref 0.50–1.10)
GFR, Est African American: >89 mL/min (ref >=60)
GFR, Est Non African American: 87 mL/min (ref >=60)
Glucose, Bld: 81 mg/dL (ref 65–99)
Potassium: 4.3 mmol/L (ref 3.5–5.3)
Sodium: 137 mmol/L (ref 135–146)
TOTAL PROTEIN: 7.6 g/dL (ref 6.1–8.1)

## 2016-05-05 LAB — CBC WITH DIFFERENTIAL/PLATELET
Basophils Absolute: 0 cells/uL (ref 0–200)
Basophils Relative: 0 %
EOS ABS: 50 {cells}/uL (ref 15–500)
Eosinophils Relative: 1 %
HEMATOCRIT: 39.6 % (ref 35.0–45.0)
Hemoglobin: 13.2 g/dL (ref 11.7–15.5)
LYMPHS PCT: 36 %
Lymphs Abs: 1800 cells/uL (ref 850–3900)
MCH: 28.7 pg (ref 27.0–33.0)
MCHC: 33.3 g/dL (ref 32.0–36.0)
MCV: 86.1 fL (ref 80.0–100.0)
MONO ABS: 350 {cells}/uL (ref 200–950)
MONOS PCT: 7 %
MPV: 10.6 fL (ref 7.5–12.5)
NEUTROS PCT: 56 %
Neutro Abs: 2800 cells/uL (ref 1500–7800)
PLATELETS: 332 10*3/uL (ref 140–400)
RBC: 4.6 MIL/uL (ref 3.80–5.10)
RDW: 12.7 % (ref 11.0–15.0)
WBC: 5 10*3/uL (ref 3.8–10.8)

## 2016-05-05 LAB — LIPID PANEL
Cholesterol: 194 mg/dL (ref <200)
HDL: 50 mg/dL — ABNORMAL LOW (ref >50)
LDL CALC: 123 mg/dL — AB (ref <100)
Total CHOL/HDL Ratio: 3.9 Ratio (ref <5.0)
Triglycerides: 103 mg/dL (ref <150)
VLDL: 21 mg/dL (ref <30)

## 2016-05-05 LAB — VITAMIN B12: Vitamin B-12: 409 pg/mL (ref 200–1100)

## 2016-05-05 LAB — TSH: TSH: 0.79 m[IU]/L

## 2016-05-06 LAB — VITAMIN D 25 HYDROXY (VIT D DEFICIENCY, FRACTURES): Vit D, 25-Hydroxy: 22 ng/mL — ABNORMAL LOW (ref 30–100)

## 2016-05-06 LAB — HEMOGLOBIN A1C
HEMOGLOBIN A1C: 4.8 % (ref <5.7)
MEAN PLASMA GLUCOSE: 91 mg/dL

## 2016-07-20 ENCOUNTER — Ambulatory Visit (INDEPENDENT_AMBULATORY_CARE_PROVIDER_SITE_OTHER): Payer: BLUE CROSS/BLUE SHIELD | Admitting: Family Medicine

## 2016-07-20 ENCOUNTER — Other Ambulatory Visit: Payer: Self-pay | Admitting: Family Medicine

## 2016-07-20 ENCOUNTER — Encounter: Payer: Self-pay | Admitting: Family Medicine

## 2016-07-20 VITALS — BP 132/68 | HR 93 | Temp 98.2°F | Resp 16 | Ht 67.0 in | Wt 248.4 lb

## 2016-07-20 DIAGNOSIS — M545 Low back pain, unspecified: Secondary | ICD-10-CM

## 2016-07-20 DIAGNOSIS — Z124 Encounter for screening for malignant neoplasm of cervix: Secondary | ICD-10-CM

## 2016-07-20 DIAGNOSIS — Z0000 Encounter for general adult medical examination without abnormal findings: Secondary | ICD-10-CM

## 2016-07-20 DIAGNOSIS — E785 Hyperlipidemia, unspecified: Secondary | ICD-10-CM | POA: Diagnosis not present

## 2016-07-20 DIAGNOSIS — E669 Obesity, unspecified: Secondary | ICD-10-CM

## 2016-07-20 DIAGNOSIS — I10 Essential (primary) hypertension: Secondary | ICD-10-CM

## 2016-07-20 DIAGNOSIS — Z01419 Encounter for gynecological examination (general) (routine) without abnormal findings: Secondary | ICD-10-CM

## 2016-07-20 MED ORDER — TRIAMTERENE-HCTZ 37.5-25 MG PO CAPS: 1.0000 | ORAL_CAPSULE | Freq: Every day | ORAL | 2 refills | Status: DC

## 2016-07-20 NOTE — Progress Notes (Signed)
Name: Kelsey Barnes   MRN: [id number]    DOB: 01-31-1981   Date:07/20/2016       Progress Note  Subjective  Chief Complaint  Chief Complaint  Patient presents with  . Annual Exam    HPI  Well Woman: not sexually active for many years, last pap smear 2016 but HPV was not done, we will repeat pap today. Family history breast cancer, maternal aunt ( mother's half sister ). No vaginal problems, no dysuria, or urinary frequency.  She has occasional stress incontinence ( discussed Kegel exercises )  HTN: she is not on coreg anymore on diuretic and likes it, no chest pain or palpitation. She lost 4 lbs since last visit, more active at work ( two job ) and better food choices.   Dyslipidemia: HDL is much better, LDL in the 120's, continue life style modification  Intermittent low back pain: she noticed mild pain over the past few months, pain sometimes go to the right outer hip, described as aching, throbbing or stiffness when she sits for a long time, no paresthesia, no bowel or bladder incontinence. She is stretching at home.    Patient Active Problem List   Diagnosis Date Noted  . Vitamin D deficiency 12/13/2014  . Perennial allergic rhinitis 12/13/2014  . Metabolic syndrome 12/13/2014  . Herpes genitalis in women 12/13/2014  . Goiter 12/13/2014  . Dyslipidemia 12/13/2014  . Hematuria, microscopic 12/13/2014  . Hypertension 09/06/2013  . Obesity (BMI 30-39.9) 09/06/2013  . H/O myomectomy 05/14/2013    Past Surgical History:  Procedure Laterality Date  . APPENDECTOMY    . CHOLECYSTECTOMY    . MYOMECTOMY  12.19.2014  . TONSILLECTOMY AND ADENOIDECTOMY N/A 10/15/2015   Procedure: TONSILLECTOMY ;  Surgeon: Bud Face, MD;  Location: [facility];  Service: ENT;  Laterality: N/A;  UPREG    Family History  Problem Relation Age of Onset  . Hypertension Mother   . Diabetes Mother   . COPD Mother   . Stroke Mother   . Hypertension Father   . Heart attack Father      Social History   Social History  . Marital status: Single    Spouse name: N/A  . Number of children: N/A  . Years of education: N/A   Occupational History  . Not on file.   Social History Main Topics  . Smoking status: Passive Smoke Exposure - Never Smoker  . Smokeless tobacco: Never Used  . Alcohol use 0.0 oz/week     Comment: occasionally  . Drug use: No  . Sexual activity: No   Other Topics Concern  . Not on file   Social History Narrative  . No narrative on file     Current Outpatient Prescriptions:  .  azelastine (ASTELIN) 0.1 % nasal spray, Place 2 sprays into both nostrils 2 (two) times daily. Use in each nostril as directed, Disp: 90 mL, Rfl: 1 .  cetirizine (ZYRTEC) 10 MG tablet, Take 10 mg by mouth 2 (two) times daily. Am and pm, Disp: , Rfl:  .  fluticasone (FLONASE) 50 MCG/ACT nasal spray, Place 2 sprays into both nostrils daily., Disp: 48 g, Rfl: 0 .  NORINYL 1+35, 28, tablet, TAKE 1 TABLET DAILY, Disp: 84 tablet, Rfl: 4 .  triamterene-hydrochlorothiazide (DYAZIDE) 37.5-25 MG capsule, Take 1 each (1 capsule total) by mouth daily., Disp: 30 capsule, Rfl: 2 .  valACYclovir (VALTREX) 500 MG tablet, Take 1 tablet (500 mg total) by mouth daily. And three times daily  prn outbreak, Disp: 100 tablet, Rfl: 4  Allergies  Allergen Reactions  . Percocet [Oxycodone-Acetaminophen] Itching and Other (See Comments)    Hot Flashes     ROS  Constitutional: Negative for fever or significant  weight change.  Respiratory: Negative for cough and shortness of breath.   Cardiovascular: Negative for chest pain or palpitations.  Gastrointestinal: Negative for abdominal pain, no bowel changes.  Musculoskeletal: Negative for gait problem or joint swelling.  Skin: Negative for rash.  Neurological: Negative for dizziness or headache.  No other specific complaints in a complete review of systems (except as listed in HPI above).   Objective  Vitals:   07/20/16 1106  BP:  132/68  Pulse: 93  Resp: 16  Temp: 98.2 F (36.8 C)  SpO2: 98%  Weight: 248 lb 6 oz (112.7 kg)  Height: '5\' 7"'$  (1.702 m)    Body mass index is 38.9 kg/m.  Physical Exam  Constitutional: Patient appears well-developed and well-nourished. No distress.  HENT: Head: Normocephalic and atraumatic. Ears: B TMs ok, no erythema or effusion; Nose: Nose normal. Mouth/Throat: Oropharynx is clear and moist. No oropharyngeal exudate.  Eyes: Conjunctivae and EOM are normal. Pupils are equal, round, and reactive to light. No scleral icterus.  Neck: Normal range of motion. Neck supple. No JVD present. No thyromegaly present.  Cardiovascular: Normal rate, regular rhythm and normal heart sounds.  No murmur heard. No BLE edema. Pulmonary/Chest: Effort normal and breath sounds normal. No respiratory distress. Abdominal: Soft. Bowel sounds are normal, no distension. There is no tenderness. no masses Breast: no lumps or masses, no nipple discharge or rashes FEMALE GENITALIA:  External genitalia normal External urethra normal Vaginal vault normal without discharge or lesions Cervix normal without discharge or lesions Bimanual exam normal without masses RECTAL: not done Musculoskeletal: Normal range of motion, no joint effusions. No gross deformities Mild pain with palpation of lumbar spine, negative straight leg raise, normal rom of hip  Neurological: he is alert and oriented to person, place, and time. No cranial nerve deficit. Coordination, balance, strength, speech and gait are normal.  Skin: Skin is warm and dry. No rash noted. No erythema.  Psychiatric: Patient has a normal mood and affect. behavior is normal. Judgment and thought content normal.  Recent Results (from the past 2160 hour(s))  COMPLETE METABOLIC PANEL WITH GFR     Status: None   Collection Time: 05/05/16 11:28 AM  Result Value Ref Range   Sodium 137 135 - 146 mmol/L   Potassium 4.3 3.5 - 5.3 mmol/L   Chloride 101 98 - 110 mmol/L    CO2 24 20 - 31 mmol/L   Glucose, Bld 81 65 - 99 mg/dL   BUN 12 7 - 25 mg/dL   Creat 0.87 0.50 - 1.10 mg/dL   Total Bilirubin 0.9 0.2 - 1.2 mg/dL   Alkaline Phosphatase 48 33 - 115 U/L   AST 20 10 - 30 U/L   ALT 19 6 - 29 U/L   Total Protein 7.6 6.1 - 8.1 g/dL   Albumin 4.2 3.6 - 5.1 g/dL   Calcium 9.3 8.6 - 10.2 mg/dL   GFR, Est African American >89 >=60 mL/min   GFR, Est Non African American 87 >=60 mL/min  CBC with Differential/Platelet     Status: None   Collection Time: 05/05/16 11:28 AM  Result Value Ref Range   WBC 5.0 3.8 - 10.8 K/uL   RBC 4.60 3.80 - 5.10 MIL/uL   Hemoglobin 13.2 11.7 - 15.5  g/dL   HCT 39.6 35.0 - 45.0 %   MCV 86.1 80.0 - 100.0 fL   MCH 28.7 27.0 - 33.0 pg   MCHC 33.3 32.0 - 36.0 g/dL   RDW 12.7 11.0 - 15.0 %   Platelets 332 140 - 400 K/uL   MPV 10.6 7.5 - 12.5 fL   Neutro Abs 2,800 1,500 - 7,800 cells/uL   Lymphs Abs 1,800 850 - 3,900 cells/uL   Monocytes Absolute 350 200 - 950 cells/uL   Eosinophils Absolute 50 15 - 500 cells/uL   Basophils Absolute 0 0 - 200 cells/uL   Neutrophils Relative % 56 %   Lymphocytes Relative 36 %   Monocytes Relative 7 %   Eosinophils Relative 1 %   Basophils Relative 0 %   Smear Review Criteria for review not met   Lipid panel     Status: Abnormal   Collection Time: 05/05/16 11:28 AM  Result Value Ref Range   Cholesterol 194 <200 mg/dL   Triglycerides 103 <150 mg/dL   HDL 50 (L) >50 mg/dL   Total CHOL/HDL Ratio 3.9 <5.0 Ratio   VLDL 21 <30 mg/dL   LDL Cholesterol 123 (H) <100 mg/dL  TSH     Status: None   Collection Time: 05/05/16 11:28 AM  Result Value Ref Range   TSH 0.79 mIU/L    Comment:   Reference Range   > or = 20 Years  0.40-4.50   Pregnancy Range First trimester  0.26-2.66 Second trimester 0.55-2.73 Third trimester  0.43-2.91     Vitamin B12     Status: None   Collection Time: 05/05/16 11:28 AM  Result Value Ref Range   Vitamin B-12 409 200 - 1,100 pg/mL  Hemoglobin A1c     Status:  None   Collection Time: 05/05/16 11:28 AM  Result Value Ref Range   Hgb A1c MFr Bld 4.8 <5.7 %    Comment:   For the purpose of screening for the presence of diabetes:   <5.7%       Consistent with the absence of diabetes 5.7-6.4 %   Consistent with increased risk for diabetes (prediabetes) >=6.5 %     Consistent with diabetes   This assay result is consistent with a decreased risk of diabetes.   Currently, no consensus exists regarding use of hemoglobin A1c for diagnosis of diabetes in children.   According to American Diabetes Association (ADA) guidelines, hemoglobin A1c <7.0% represents optimal control in non-pregnant diabetic patients. Different metrics may apply to specific patient populations. Standards of Medical Care in Diabetes (ADA).      Mean Plasma Glucose 91 mg/dL  VITAMIN D 25 Hydroxy (Vit-D Deficiency, Fractures)     Status: Abnormal   Collection Time: 05/05/16 11:28 AM  Result Value Ref Range   Vit D, 25-Hydroxy 22 (L) 30 - 100 ng/mL    Comment: Vitamin D Status           25-OH Vitamin D        Deficiency                <20 ng/mL        Insufficiency         20 - 29 ng/mL        Optimal             > or = 30 ng/mL   For 25-OH Vitamin D testing on patients on D2-supplementation and patients for whom quantitation of D2 and D3 fractions is  required, the QuestAssureD 25-OH VIT D, (D2,D3), LC/MS/MS is recommended: order code [phone] (patients > 2 yrs).       PHQ2/9: Depression screen [facility] 2/9 07/20/2016 12/03/2015 07/17/2015 04/18/2015 12/13/2014  Decreased Interest 0 0 0 0 0  Down, Depressed, Hopeless 0 0 0 0 0  PHQ - 2 Score 0 0 0 0 0     Fall Risk: Fall Risk  07/20/2016 12/03/2015 07/17/2015 04/18/2015 12/13/2014  Falls in the past year? No No No No No     Assessment & Plan  1. Well woman exam  Discussed importance of 150 minutes of physical activity weekly, eat two servings of fish weekly, eat one serving of tree nuts ( cashews, pistachios, pecans,  almonds.Marland Kitchen) every other day, eat 6 servings of fruit/vegetables daily and drink plenty of water and avoid sweet beverages.   2. Essential hypertension  - triamterene-hydrochlorothiazide (DYAZIDE) 37.5-25 MG capsule; Take 1 each (1 capsule total) by mouth daily.  Dispense: 30 capsule; Refill: 2  3. Cervical cancer screening  - Pap IG and HPV (high risk) DNA detection  4. Intermittent low back pain  - Ambulatory referral to Chiropractic  5. Obesity (BMI 30-39.9)  Discussed with the patient the risk posed by an increased BMI. Discussed importance of portion control, calorie counting and at least 150 minutes of physical activity weekly. Avoid sweet beverages and drink more water. Eat at least 6 servings of fruit and vegetables daily   6. Dyslipidemia  Continue the hard work, HDL has improved

## 2016-07-22 LAB — PAP IG AND HPV HIGH-RISK: HPV DNA HIGH RISK: NOT DETECTED

## 2016-07-30 ENCOUNTER — Ambulatory Visit: Payer: BLUE CROSS/BLUE SHIELD | Admitting: Family Medicine

## 2016-08-17 DIAGNOSIS — M5431 Sciatica, right side: Secondary | ICD-10-CM | POA: Diagnosis not present

## 2016-08-17 DIAGNOSIS — M9903 Segmental and somatic dysfunction of lumbar region: Secondary | ICD-10-CM | POA: Diagnosis not present

## 2016-08-17 DIAGNOSIS — M9902 Segmental and somatic dysfunction of thoracic region: Secondary | ICD-10-CM | POA: Diagnosis not present

## 2016-08-17 DIAGNOSIS — M545 Low back pain: Secondary | ICD-10-CM | POA: Diagnosis not present

## 2016-08-24 DIAGNOSIS — M9902 Segmental and somatic dysfunction of thoracic region: Secondary | ICD-10-CM | POA: Diagnosis not present

## 2016-08-24 DIAGNOSIS — M545 Low back pain: Secondary | ICD-10-CM | POA: Diagnosis not present

## 2016-08-24 DIAGNOSIS — M9903 Segmental and somatic dysfunction of lumbar region: Secondary | ICD-10-CM | POA: Diagnosis not present

## 2016-08-24 DIAGNOSIS — M5431 Sciatica, right side: Secondary | ICD-10-CM | POA: Diagnosis not present

## 2016-08-31 DIAGNOSIS — M9902 Segmental and somatic dysfunction of thoracic region: Secondary | ICD-10-CM | POA: Diagnosis not present

## 2016-08-31 DIAGNOSIS — M545 Low back pain: Secondary | ICD-10-CM | POA: Diagnosis not present

## 2016-08-31 DIAGNOSIS — M5431 Sciatica, right side: Secondary | ICD-10-CM | POA: Diagnosis not present

## 2016-08-31 DIAGNOSIS — M9903 Segmental and somatic dysfunction of lumbar region: Secondary | ICD-10-CM | POA: Diagnosis not present

## 2016-09-07 DIAGNOSIS — M545 Low back pain: Secondary | ICD-10-CM | POA: Diagnosis not present

## 2016-09-07 DIAGNOSIS — M9902 Segmental and somatic dysfunction of thoracic region: Secondary | ICD-10-CM | POA: Diagnosis not present

## 2016-09-07 DIAGNOSIS — M9903 Segmental and somatic dysfunction of lumbar region: Secondary | ICD-10-CM | POA: Diagnosis not present

## 2016-09-07 DIAGNOSIS — M5431 Sciatica, right side: Secondary | ICD-10-CM | POA: Diagnosis not present

## 2016-09-14 DIAGNOSIS — M545 Low back pain: Secondary | ICD-10-CM | POA: Diagnosis not present

## 2016-09-14 DIAGNOSIS — M9902 Segmental and somatic dysfunction of thoracic region: Secondary | ICD-10-CM | POA: Diagnosis not present

## 2016-09-14 DIAGNOSIS — M5431 Sciatica, right side: Secondary | ICD-10-CM | POA: Diagnosis not present

## 2016-09-14 DIAGNOSIS — M9903 Segmental and somatic dysfunction of lumbar region: Secondary | ICD-10-CM | POA: Diagnosis not present

## 2016-09-22 DIAGNOSIS — M9902 Segmental and somatic dysfunction of thoracic region: Secondary | ICD-10-CM | POA: Diagnosis not present

## 2016-09-22 DIAGNOSIS — M545 Low back pain: Secondary | ICD-10-CM | POA: Diagnosis not present

## 2016-09-22 DIAGNOSIS — M9903 Segmental and somatic dysfunction of lumbar region: Secondary | ICD-10-CM | POA: Diagnosis not present

## 2016-09-22 DIAGNOSIS — M5431 Sciatica, right side: Secondary | ICD-10-CM | POA: Diagnosis not present

## 2016-09-27 DIAGNOSIS — M5431 Sciatica, right side: Secondary | ICD-10-CM | POA: Diagnosis not present

## 2016-09-27 DIAGNOSIS — M9902 Segmental and somatic dysfunction of thoracic region: Secondary | ICD-10-CM | POA: Diagnosis not present

## 2016-09-27 DIAGNOSIS — M9903 Segmental and somatic dysfunction of lumbar region: Secondary | ICD-10-CM | POA: Diagnosis not present

## 2016-09-27 DIAGNOSIS — M545 Low back pain: Secondary | ICD-10-CM | POA: Diagnosis not present

## 2016-10-06 ENCOUNTER — Ambulatory Visit (INDEPENDENT_AMBULATORY_CARE_PROVIDER_SITE_OTHER): Payer: BLUE CROSS/BLUE SHIELD | Admitting: Family Medicine

## 2016-10-06 ENCOUNTER — Encounter: Payer: Self-pay | Admitting: Family Medicine

## 2016-10-06 VITALS — BP 134/78 | HR 95 | Temp 98.3°F | Resp 16 | Ht 67.0 in | Wt 254.1 lb

## 2016-10-06 DIAGNOSIS — M79672 Pain in left foot: Secondary | ICD-10-CM | POA: Diagnosis not present

## 2016-10-06 DIAGNOSIS — I10 Essential (primary) hypertension: Secondary | ICD-10-CM

## 2016-10-06 MED ORDER — TRIAMTERENE-HCTZ 37.5-25 MG PO CAPS: 1.0000 | ORAL_CAPSULE | Freq: Every day | ORAL | 1 refills | Status: DC

## 2016-10-06 MED ORDER — ETODOLAC 500 MG PO TABS: 500.0000 mg | ORAL_TABLET | Freq: Two times a day (BID) | ORAL | 0 refills | Status: DC

## 2016-10-06 NOTE — Progress Notes (Signed)
Name: Kelsey Barnes   MRN: [id number]    DOB: 1981/01/12   Date:10/06/2016       Progress Note  Subjective  Chief Complaint  Chief Complaint  Patient presents with  . Foot Pain    left foot for 3 weeks without injury    HPI  Left Foot: she states symptoms started gradually over the past few weeks, worse after her shift at [employer] ( works as a Clinical research associate ), she noticed pain with activity, swelling, mild improvement with ibuprofen , having difficulty walking and it causes her to limp at times, no fever or increase in warmth. She does not recall any injuries. She has been taking more hours at work over the past 2 weeks, but symptoms started prior to that.    HTN: she needs 90 day supply of her medication, no chest pain, palpitation or chest pain  Patient Active Problem List   Diagnosis Date Noted  . Vitamin D deficiency 12/13/2014  . Perennial allergic rhinitis 12/13/2014  . Metabolic syndrome 34/91/7915  . Herpes genitalis in women 12/13/2014  . Goiter 12/13/2014  . Dyslipidemia 12/13/2014  . Hematuria, microscopic 12/13/2014  . Hypertension 09/06/2013  . Obesity (BMI 30-39.9) 09/06/2013  . H/O myomectomy 05/14/2013    Past Surgical History:  Procedure Laterality Date  . APPENDECTOMY    . CHOLECYSTECTOMY    . MYOMECTOMY  12.19.2014  . TONSILLECTOMY AND ADENOIDECTOMY N/A 10/15/2015   Procedure: TONSILLECTOMY ;  Surgeon: Carloyn Manner, MD;  Location: [facility];  Service: ENT;  Laterality: N/A;  UPREG    Family History  Problem Relation Age of Onset  . Hypertension Mother   . Diabetes Mother   . COPD Mother   . Stroke Mother   . Hypertension Father   . Heart attack Father     Social History   Social History  . Marital status: Single    Spouse name: N/A  . Number of children: N/A  . Years of education: N/A   Occupational History  . Not on file.   Social History Main Topics  . Smoking status: Passive Smoke Exposure - Never Smoker  . Smokeless  tobacco: Never Used  . Alcohol use 0.0 oz/week     Comment: occasionally  . Drug use: No  . Sexual activity: No   Other Topics Concern  . Not on file   Social History Narrative   She works full time at [employer] second shift and late shift from 11:30 to 7 am at [employer]      Current Outpatient Prescriptions:  .  azelastine (ASTELIN) 0.1 % nasal spray, Place 2 sprays into both nostrils 2 (two) times daily. Use in each nostril as directed, Disp: 90 mL, Rfl: 1 .  cetirizine (ZYRTEC) 10 MG tablet, Take 10 mg by mouth 2 (two) times daily. Am and pm, Disp: , Rfl:  .  fluticasone (FLONASE) 50 MCG/ACT nasal spray, Place 2 sprays into both nostrils daily., Disp: 48 g, Rfl: 0 .  NORINYL 1+35, 28, tablet, TAKE 1 TABLET DAILY, Disp: 84 tablet, Rfl: 4 .  triamterene-hydrochlorothiazide (DYAZIDE) 37.5-25 MG capsule, Take 1 each (1 capsule total) by mouth daily., Disp: 30 capsule, Rfl: 2 .  valACYclovir (VALTREX) 500 MG tablet, Take 1 tablet (500 mg total) by mouth daily. And three times daily prn outbreak, Disp: 100 tablet, Rfl: 4  Allergies  Allergen Reactions  . Percocet [Oxycodone-Acetaminophen] Itching and Other (See Comments)    Hot Flashes     ROS  Ten systems reviewed and is negative except as mentioned in HPI   Objective  Vitals:   10/06/16 0848  BP: 134/78  Pulse: 95  Resp: 16  Temp: 98.3 F (36.8 C)  SpO2: 92%  Weight: 254 lb 1 oz (115.2 kg)  Height: 5\' 7"  (1.702 m)    Body mass index is 39.79 kg/m.  Physical Exam  Constitutional: Patient appears well-developed and well-nourished. Obese  No distress.  HEENT: head atraumatic, normocephalic, pupils equal and reactive to light, neck supple, throat within normal limits Cardiovascular: Normal rate, regular rhythm and normal heart sounds.  No murmur heard. No BLE edema. Pulmonary/Chest: Effort normal and breath sounds normal. No respiratory distress. Abdominal: Soft.  There is no tenderness. Muscular Skeletal: edema on  dorsal aspect of left foot, pain during palpation of dorsal aspect of left foot, no pain during lateral compression of the foot, no redness or increase in warmth Psychiatric: Patient has a normal mood and affect. behavior is normal. Judgment and thought content normal.  Recent Results (from the past 2160 hour(s))  Pap IG and HPV (high risk) DNA detection     Status: None   Collection Time: 07/20/16 11:50 AM  Result Value Ref Range   HPV DNA High Risk Not Detected     Comment: HIGH RISK HPV types (16,18,31,33,35,39,45,51,52,56,58,59,66,68) were not detected. Other HPV types which cause anogenital lesions may be present. The significance of the other types of HPV in malignant  processes has not been established.                  ** Normal Reference Range: Not Detected **      HPV High Risk testing performed using the APTIMA HPV mRNA Assay.      Specimen adequacy:      Comment: SATISFACTORY.  Endocervical/transformation zone component present.   FINAL DIAGNOSIS:      Comment: - NEGATIVE FOR INTRAEPITHELIAL LESIONS OR MALIGNANCY.    COMMENTS:      Comment: This Pap test has been evaluated with computer assisted technology.   Cytotechnologist:      Comment: JLT, BS CT(ASCP) *  The Pap is a screening test for cervical cancer. It is not a  diagnostic test and is subject to false negative and false positive  results. It is most reliable when a satisfactory sample, regularly  obtained, is submitted with relevant clinical findings and history,  and when the Pap result is evaluated along with historic and current  clinical information.       PHQ2/9: Depression screen [facility] 2/9 07/20/2016 12/03/2015 07/17/2015 04/18/2015 12/13/2014  Decreased Interest 0 0 0 0 0  Down, Depressed, Hopeless 0 0 0 0 0  PHQ - 2 Score 0 0 0 0 0     Fall Risk: Fall Risk  07/20/2016 12/03/2015 07/17/2015 04/18/2015 12/13/2014  Falls in the past year? No No No No No     Assessment & Plan  1. Left foot  pain  - etodolac (LODINE) 500 MG tablet; Take 1 tablet (500 mg total) by mouth 2 (two) times daily.  Dispense: 60 tablet; Refill: 0  Discussed Ice, elevation and rest, try NSAID's and discuss K laser therapy with chiropractor but if no improvement call back for referral to Podiatrist  2. Essential hypertension  - triamterene-hydrochlorothiazide (DYAZIDE) 37.5-25 MG capsule; Take 1 each (1 capsule total) by mouth daily.  Dispense: 90 capsule; Refill: 1

## 2016-10-06 NOTE — Patient Instructions (Signed)
RICE for Routine Care of Injuries Theroutine careofmanyinjuriesincludes rest, ice, compression, and elevation (RICE therapy). RICE therapy is often recommended for injuries to soft tissues, such as a muscle strain, ligament injuries, bruises, and overuse injuries. It can also be used for some bony injuries. Using RICE therapy can help to relieve pain, lessen swelling, and enable your body to heal. Rest Rest is required to allow your body to heal. This usually involves reducing your normal activities and avoiding use of the injured part of your body. Generally, you can return to your normal activities when you are comfortable and have been given permission by your health care provider. Ice  Icing your injury helps to keep the swelling down, and it lessens pain. Do not apply ice directly to your skin.  Put ice in a plastic bag.  Place a towel between your skin and the bag.  Leave the ice on for 20 minutes, 2-3 times a day.  Do this for as long as you are directed by your health care provider. Compression Compression means putting pressure on the injured area. Compression helps to keep swelling down, gives support, and helps with discomfort. Compression may be done with an elastic bandage. If an elastic bandage has been applied, follow these general tips:  Remove and reapply the bandage every 3-4 hours or as directed by your health care provider.  Make sure the bandage is not wrapped too tightly, because this can cut off circulation. If part of your body beyond the bandage becomes blue, numb, cold, swollen, or more painful, your bandage is most likely too tight. If this occurs, remove your bandage and reapply it more loosely.  See your health care provider if the bandage seems to be making your problems worse rather than better.  Elevation  Elevation means keeping the injured area raised. This helps to lessen swelling and decrease pain. If possible, your injured area should be elevated at  or above the level of your heart or the center of your chest. When should I seek medical care?  If your pain and swelling continue.  If your symptoms are getting worse rather than improving. These symptoms may indicate that further evaluation or further X-rays are needed. Sometimes, X-rays may not show a small broken bone (fracture) until a number of days later. Make a follow-up appointment with your health care provider. When should I seek immediate medical care?  If you have sudden severe pain at or below the area of your injury.  If you have redness or increased swelling around your injury.  If you have tingling or numbness at or below the area of your injury that does not improve after you remove the elastic bandage. This information is not intended to replace advice given to you by your health care provider. Make sure you discuss any questions you have with your health care provider. Document Released: 08/29/2000 Document Revised: 10/21/2015 Document Reviewed: 04/24/2014 Elsevier Interactive Patient Education  2017 Elsevier Inc.  

## 2016-10-18 ENCOUNTER — Other Ambulatory Visit: Payer: Self-pay | Admitting: Family Medicine

## 2016-10-18 ENCOUNTER — Telehealth: Payer: Self-pay | Admitting: Family Medicine

## 2016-10-18 MED ORDER — NORETHINDRONE-ETH ESTRADIOL 0.5-35 MG-MCG PO TABS: 1.0000 | ORAL_TABLET | Freq: Every day | ORAL | 0 refills | Status: DC

## 2016-10-18 NOTE — Telephone Encounter (Signed)
Sent one package. If it goes through [place] know and we will send 90 day supply to [facility]

## 2016-10-18 NOTE — Telephone Encounter (Signed)
PT NOTIFIED  

## 2016-10-18 NOTE — Telephone Encounter (Signed)
Birth control pills that she has been taken has been discontinued by FDA and that it no long have the NCD number on it so there for they will not cover it. She needs to given another type. She said that they suggested the one that is close to what she was taking is NECON. She is out and needs this to called to [facility] for a 30 day supply since she is out and the other refills to [facility].

## 2016-11-10 ENCOUNTER — Telehealth: Payer: Self-pay | Admitting: Family Medicine

## 2016-11-10 NOTE — Telephone Encounter (Signed)
PT SAYS THAT THE NEW RX IS WORKING AND THAT SHE NEEDS YOU TO CALL THE REMAINDER OF THE RX IN TO HER PHARM [facility] ON [address]. SHE WANTS ANOTHER MONTH SENT TO [facility]  THEN THE OTHER MONTHS SENT TO HER MAIL ORDER PHARM.

## 2016-11-11 ENCOUNTER — Other Ambulatory Visit: Payer: Self-pay | Admitting: Family Medicine

## 2016-11-11 MED ORDER — NORETHINDRONE-ETH ESTRADIOL 0.5-35 MG-MCG PO TABS: 1.0000 | ORAL_TABLET | Freq: Every day | ORAL | 0 refills | Status: DC

## 2016-11-11 MED ORDER — NORETHINDRONE-ETH ESTRADIOL 0.5-35 MG-MCG PO TABS: 1.0000 | ORAL_TABLET | Freq: Every day | ORAL | 3 refills | Status: DC

## 2016-11-11 NOTE — Telephone Encounter (Signed)
What rx? The birth control?

## 2016-11-11 NOTE — Telephone Encounter (Signed)
sent 

## 2016-11-11 NOTE — Telephone Encounter (Signed)
PT NOTIFIED  

## 2016-11-11 NOTE — Telephone Encounter (Signed)
THE MEDICATION IS BIRTH CONTROL

## 2016-11-23 ENCOUNTER — Ambulatory Visit: Payer: BLUE CROSS/BLUE SHIELD | Admitting: Family Medicine

## 2016-12-21 ENCOUNTER — Other Ambulatory Visit: Payer: Self-pay | Admitting: Family Medicine

## 2016-12-21 DIAGNOSIS — A6009 Herpesviral infection of other urogenital tract: Secondary | ICD-10-CM

## 2017-02-27 ENCOUNTER — Other Ambulatory Visit: Payer: Self-pay | Admitting: Family Medicine

## 2017-02-27 DIAGNOSIS — I10 Essential (primary) hypertension: Secondary | ICD-10-CM

## 2017-02-27 NOTE — Telephone Encounter (Signed)
She needs follow up, I can send a refill once appointment is made. Ask where is her local pharmacy

## 2017-02-28 NOTE — Telephone Encounter (Signed)
Left voicemail to return call to schedule appt

## 2017-04-15 ENCOUNTER — Ambulatory Visit (INDEPENDENT_AMBULATORY_CARE_PROVIDER_SITE_OTHER): Payer: BLUE CROSS/BLUE SHIELD | Admitting: Family Medicine

## 2017-04-15 ENCOUNTER — Encounter: Payer: Self-pay | Admitting: Family Medicine

## 2017-04-15 VITALS — BP 130/60 | HR 110 | Resp 14 | Ht 67.0 in | Wt 238.3 lb

## 2017-04-15 DIAGNOSIS — R000 Tachycardia, unspecified: Secondary | ICD-10-CM

## 2017-04-15 DIAGNOSIS — E785 Hyperlipidemia, unspecified: Secondary | ICD-10-CM

## 2017-04-15 DIAGNOSIS — E8881 Metabolic syndrome: Secondary | ICD-10-CM | POA: Diagnosis not present

## 2017-04-15 DIAGNOSIS — I10 Essential (primary) hypertension: Secondary | ICD-10-CM

## 2017-04-15 DIAGNOSIS — Z23 Encounter for immunization: Secondary | ICD-10-CM

## 2017-04-15 DIAGNOSIS — E669 Obesity, unspecified: Secondary | ICD-10-CM | POA: Diagnosis not present

## 2017-04-15 DIAGNOSIS — E559 Vitamin D deficiency, unspecified: Secondary | ICD-10-CM

## 2017-04-15 DIAGNOSIS — J3089 Other allergic rhinitis: Secondary | ICD-10-CM | POA: Diagnosis not present

## 2017-04-15 MED ORDER — FLUTICASONE PROPIONATE 50 MCG/ACT NA SUSP: 2.0000 | Freq: Every day | NASAL | 2 refills | Status: DC

## 2017-04-15 MED ORDER — AZELASTINE HCL 0.1 % NA SOLN
2.0000 | Freq: Two times a day (BID) | NASAL | 2 refills | Status: DC
Start: 2017-04-15 — End: 2018-03-15

## 2017-04-15 MED ORDER — TRIAMTERENE-HCTZ 37.5-25 MG PO CAPS: 1.0000 | ORAL_CAPSULE | Freq: Every day | ORAL | 1 refills | Status: DC

## 2017-04-15 NOTE — Progress Notes (Signed)
Name: Kelsey Barnes   MRN: [id number]    DOB: [birth year]   Date:04/15/2017       Progress Note  Subjective  Chief Complaint  Chief Complaint  Patient presents with  . Medication Refill  . Hypertension  . Obesity    HPI  HTN: she is not on coreg anymore on diuretic and tolerates it well,  no chest pain or palpitation, HR Is up but she states she rushed here from work. . She lost another 16 lbs since last visit, she is working two jobs also has been meal planning, on high protein and low carb diet, feeling better.   Obesity: she has lost 20 lbs in the past year, she has changed her diet, but we will also check labs to make sure not an organic cause.   Dyslipidemia: HDL is much better, LDL was 123 continue life style modification, we will recheck labs  Intermittent low back pain: she stopped going to chiropractor because she did not have time, she states pain is better, not as frequent.   Metabolic syndrome: last RSWN4O was normal, she denies polyphagia, polydipsia or polyuria. We will recheck labs, on a low carb diet  AR: she would like refill of nasal spray, she has noticed mild rhinorrhea and nasal congestion, mild sneezing also, no cough orSOB  Patient Active Problem List   Diagnosis Date Noted  . Vitamin D deficiency 12/13/2014  . Perennial allergic rhinitis 12/13/2014  . Metabolic syndrome 27/07/5007  . Herpes genitalis in women 12/13/2014  . Goiter 12/13/2014  . Dyslipidemia 12/13/2014  . Hematuria, microscopic 12/13/2014  . Hypertension 09/06/2013  . Obesity (BMI 30-39.9) 09/06/2013  . H/O myomectomy 05/14/2013    Past Surgical History:  Procedure Laterality Date  . APPENDECTOMY    . CHOLECYSTECTOMY    . MYOMECTOMY  12.19.2014  . TONSILLECTOMY N/A 10/15/2015   Performed by Carloyn Manner, MD at [facility]    Family History  Problem Relation Age of Onset  . Hypertension Mother   . Diabetes Mother   . COPD Mother   . Stroke Mother   .  Hypertension Father   . Heart attack Father     Social History   Socioeconomic History  . Marital status: Single    Spouse name: Not on file  . Number of children: Not on file  . Years of education: Not on file  . Highest education level: Not on file  Social Needs  . Financial resource strain: Not on file  . Food insecurity - worry: Not on file  . Food insecurity - inability: Not on file  . Transportation needs - medical: Not on file  . Transportation needs - non-medical: Not on file  Occupational History  . Not on file  Tobacco Use  . Smoking status: Passive Smoke Exposure - Never Smoker  . Smokeless tobacco: Never Used  Substance and Sexual Activity  . Alcohol use: Yes    Alcohol/week: 0.0 oz    Comment: occasionally  . Drug use: No  . Sexual activity: No  Other Topics Concern  . Not on file  Social History Narrative   She works full time at [employer] second shift and late shift from 11:30 to 7 am at [employer]      Current Outpatient Medications:  .  azelastine (ASTELIN) 0.1 % nasal spray, Place 2 sprays 2 (two) times daily into both nostrils. Use in each nostril as directed, Disp: 30 mL, Rfl: 2 .  cetirizine (ZYRTEC) 10  MG tablet, Take 10 mg by mouth 2 (two) times daily. Am and pm, Disp: , Rfl:  .  fluticasone (FLONASE) 50 MCG/ACT nasal spray, Place 2 sprays daily into both nostrils., Disp: 16 g, Rfl: 2 .  norethindrone-ethinyl estradiol (NECON 0.5/35, 28,) 0.5-35 MG-MCG tablet, Take 1 tablet by mouth daily., Disp: 3 Package, Rfl: 3 .  triamterene-hydrochlorothiazide (DYAZIDE) 37.5-25 MG capsule, Take 1 each (1 capsule total) daily by mouth., Disp: 90 capsule, Rfl: 1 .  valACYclovir (VALTREX) 500 MG tablet, TAKE 1 TABLET DAILY AND THREE TIMES DAILY AS NEEDED FOR OUTBREAK, Disp: 100 tablet, Rfl: 4  Allergies  Allergen Reactions  . Percocet [Oxycodone-Acetaminophen] Itching and Other (See Comments)    Hot Flashes     ROS  Constitutional: Negative for fever, positive  for weight change.  Respiratory: Negative for cough and shortness of breath.   Cardiovascular: Negative for chest pain or palpitations.  Gastrointestinal: Negative for abdominal pain, no bowel changes.  Musculoskeletal: Negative for gait problem or joint swelling.  Skin: Negative for rash.  Neurological: Negative for dizziness or headache.  No other specific complaints in a complete review of systems (except as listed in HPI above).  Objective  Vitals:   04/15/17 1336  BP: 130/60  Pulse: (!) 110  Resp: 14  SpO2: 98%  Weight: 238 lb 4.8 oz (108.1 kg)  Height: 5\' 7"  (1.702 m)    Body mass index is 37.32 kg/m.  Physical Exam  Constitutional: Patient appears well-developed and well-nourished. Obese  No distress.  HEENT: head atraumatic, normocephalic, pupils equal and reactive to light,  neck supple, throat within normal limits Cardiovascular: Normal rate, regular rhythm and normal heart sounds.  No murmur heard. No BLE edema. Pulmonary/Chest: Effort normal and breath sounds normal. No respiratory distress. Abdominal: Soft.  There is no tenderness. Psychiatric: Patient has a normal mood and affect. behavior is normal. Judgment and thought content normal.  PHQ2/9: Depression screen [facility] 2/9 07/20/2016 12/03/2015 07/17/2015 04/18/2015 12/13/2014  Decreased Interest 0 0 0 0 0  Down, Depressed, Hopeless 0 0 0 0 0  PHQ - 2 Score 0 0 0 0 0    Fall Risk: Fall Risk  04/15/2017 07/20/2016 12/03/2015 07/17/2015 04/18/2015  Falls in the past year? No No No No No     Assessment & Plan   1. Essential hypertension  - triamterene-hydrochlorothiazide (DYAZIDE) 37.5-25 MG capsule; Take 1 each (1 capsule total) daily by mouth.  Dispense: 90 capsule; Refill: 1 - COMPLETE METABOLIC PANEL WITH GFR - CBC with Differential/Platelet  2. Needs flu shot  - Flu Vaccine QUAD 36+ mos IM  3. Dyslipidemia  - Lipid panel  4. Obesity (BMI 30-39.9)  Discussed with the patient the risk posed by an  increased BMI. Discussed importance of portion control, calorie counting and at least 150 minutes of physical activity weekly. Avoid sweet beverages and drink more water. Eat at least 6 servings of fruit and vegetables daily   5. Perennial allergic rhinitis  - azelastine (ASTELIN) 0.1 % nasal spray; Place 2 sprays 2 (two) times daily into both nostrils. Use in each nostril as directed  Dispense: 30 mL; Refill: 2 - fluticasone (FLONASE) 50 MCG/ACT nasal spray; Place 2 sprays daily into both nostrils.  Dispense: 16 g; Refill: 2  6. Vitamin D deficiency  - VITAMIN D 25 Hydroxy (Vit-D Deficiency, Fractures)  7. Metabolic syndrome  - Hemoglobin A1c - Insulin, random  8. Tachycardia  - TSH

## 2017-04-22 DIAGNOSIS — E8881 Metabolic syndrome: Secondary | ICD-10-CM | POA: Diagnosis not present

## 2017-04-22 DIAGNOSIS — I10 Essential (primary) hypertension: Secondary | ICD-10-CM | POA: Diagnosis not present

## 2017-04-22 DIAGNOSIS — E785 Hyperlipidemia, unspecified: Secondary | ICD-10-CM | POA: Diagnosis not present

## 2017-04-22 DIAGNOSIS — E559 Vitamin D deficiency, unspecified: Secondary | ICD-10-CM | POA: Diagnosis not present

## 2017-04-22 LAB — BASIC METABOLIC PANEL
Creatinine: 0.7 (ref 0.5–1.1)
Glucose: 66
POTASSIUM: 4.2 (ref 3.4–5.3)
SODIUM: 140 (ref 137–147)

## 2017-04-22 LAB — CBC AND DIFFERENTIAL
HCT: 37 (ref 36–46)
Hemoglobin: 12.8 (ref 12.0–16.0)
Platelets: 348 (ref 150–399)
WBC: 8.4

## 2017-04-22 LAB — TSH: TSH: 1.86 (ref 0.41–5.90)

## 2017-04-22 LAB — LIPID PANEL
CHOLESTEROL: 192 (ref 0–200)
HDL: 55 (ref 35–70)
LDL Cholesterol: 114
TRIGLYCERIDES: 114 (ref 40–160)

## 2017-04-22 LAB — VITAMIN D 25 HYDROXY (VIT D DEFICIENCY, FRACTURES): VIT D 25 HYDROXY: 22.2

## 2017-04-22 LAB — HEMOGLOBIN A1C: Hemoglobin A1C: 4.8

## 2017-04-26 ENCOUNTER — Encounter: Payer: Self-pay | Admitting: Family Medicine

## 2017-09-21 ENCOUNTER — Other Ambulatory Visit: Payer: Self-pay | Admitting: Family Medicine

## 2017-09-30 ENCOUNTER — Other Ambulatory Visit: Payer: Self-pay | Admitting: Family Medicine

## 2017-09-30 DIAGNOSIS — I10 Essential (primary) hypertension: Secondary | ICD-10-CM

## 2017-09-30 NOTE — Telephone Encounter (Signed)
Copied from [place]. Topic: Quick Communication - Rx Refill/Question >> Sep 30, 2017 11:03 AM Aurelio Brash B wrote: Medication: triamterene-hydrochlorothiazide (DYAZIDE) 37.5-25 MG capsule PT wants 2 week supply for now   Has the patient contacted their pharmacy? No (Agent: If no, request that the patient contact the pharmacy for the refill.)  Preferred Pharmacy (with phone number or street name):[facility] (N), [place] [facility] [place] [address] [phone] (Phone) [phone] (Fax)       Agent: Please be advised that RX refills may take up to 3 business days. We ask that you follow-up with your pharmacy.

## 2017-10-03 MED ORDER — TRIAMTERENE-HCTZ 37.5-25 MG PO CAPS: 1.0000 | ORAL_CAPSULE | Freq: Every day | ORAL | 0 refills | Status: DC

## 2017-10-14 ENCOUNTER — Ambulatory Visit (INDEPENDENT_AMBULATORY_CARE_PROVIDER_SITE_OTHER): Payer: BLUE CROSS/BLUE SHIELD | Admitting: Family Medicine

## 2017-10-14 ENCOUNTER — Encounter: Payer: Self-pay | Admitting: Family Medicine

## 2017-10-14 ENCOUNTER — Encounter: Payer: BLUE CROSS/BLUE SHIELD | Admitting: Family Medicine

## 2017-10-14 VITALS — BP 146/90 | HR 97 | Temp 97.9°F | Resp 16 | Ht 68.0 in | Wt 246.8 lb

## 2017-10-14 DIAGNOSIS — I10 Essential (primary) hypertension: Secondary | ICD-10-CM | POA: Diagnosis not present

## 2017-10-14 DIAGNOSIS — A6009 Herpesviral infection of other urogenital tract: Secondary | ICD-10-CM

## 2017-10-14 DIAGNOSIS — E669 Obesity, unspecified: Secondary | ICD-10-CM | POA: Diagnosis not present

## 2017-10-14 DIAGNOSIS — E785 Hyperlipidemia, unspecified: Secondary | ICD-10-CM

## 2017-10-14 DIAGNOSIS — R350 Frequency of micturition: Secondary | ICD-10-CM | POA: Diagnosis not present

## 2017-10-14 DIAGNOSIS — J3089 Other allergic rhinitis: Secondary | ICD-10-CM

## 2017-10-14 DIAGNOSIS — Z01419 Encounter for gynecological examination (general) (routine) without abnormal findings: Secondary | ICD-10-CM

## 2017-10-14 DIAGNOSIS — E559 Vitamin D deficiency, unspecified: Secondary | ICD-10-CM

## 2017-10-14 DIAGNOSIS — E8881 Metabolic syndrome: Secondary | ICD-10-CM

## 2017-10-14 LAB — POCT URINALYSIS DIPSTICK
Bilirubin, UA: NEGATIVE
Glucose, UA: NEGATIVE
KETONES UA: NEGATIVE
Leukocytes, UA: NEGATIVE
NITRITE UA: NEGATIVE
PH UA: 5 (ref 5.0–8.0)
SPEC GRAV UA: 1.015 (ref 1.010–1.025)
UROBILINOGEN UA: 0.2 U/dL

## 2017-10-14 MED ORDER — TRIAMTERENE-HCTZ 37.5-25 MG PO CAPS: 1.0000 | ORAL_CAPSULE | Freq: Every day | ORAL | 0 refills | Status: DC

## 2017-10-14 MED ORDER — NORETHINDRONE-ETH ESTRADIOL 0.5-35 MG-MCG PO TABS: 1.0000 | ORAL_TABLET | Freq: Every day | ORAL | 3 refills | Status: DC

## 2017-10-14 MED ORDER — VALACYCLOVIR HCL 500 MG PO TABS: ORAL_TABLET | ORAL | 3 refills | Status: DC

## 2017-10-14 NOTE — Progress Notes (Signed)
Name: Kelsey Barnes   MRN: [id number]    DOB: [birth year]   Date:10/14/2017       Progress Note  Subjective  Chief Complaint  Chief Complaint  Patient presents with  . Annual Exam  . Medication Refill  . Hypertension    Headaches  . Dyslipidemia  . Allergic Rhinitis     Sneezing and coughing  . Urinary Retention    Onset-2 weeks, urinary hesitancy, pressure, burning after she is doing urinating.    HPI   Patient presents for annual CPE and follow up  Diet: avoiding salt and caffeine  HTN: she is off ACE because ENT states not good for her - she is on Triamterene hctz , bp is slightly elevated, she states at home around 130/80's. Denies chest pain or palpitation.   AR: she has two nasal sprays and takes Zyrtec at home, but still has some sneezing and dry cough, she states not compliant with nasal spray lately  Urinary retention: she noticed urinary hesitancy, some supra pubic discomfort when she voids, no previous history of kidney stones. No blood in urine no dysuria. She has a history of myomectomy and had similar symptoms in the past, we will check urine culture and if negative follow up with gyn.   Herpes : no outbreaks as long as she takes medication   Metabolic syndrome: last labs were normal, she is avoiding sweets, discussed life style modification. Denies polyphagia, polyuria or polydipsia   USPSTF grade A and B recommendations  Depression:  Depression screen [facility] 2/9 10/14/2017 07/20/2016 12/03/2015 07/17/2015 04/18/2015  Decreased Interest 0 0 0 0 0  Down, Depressed, Hopeless 0 0 0 0 0  PHQ - 2 Score 0 0 0 0 0  Altered sleeping 1 - - - -  Tired, decreased energy 1 - - - -  Change in appetite 0 - - - -  Feeling bad or failure about yourself  1 - - - -  Trouble concentrating 1 - - - -  Moving slowly or fidgety/restless 0 - - - -  Suicidal thoughts 0 - - - -  PHQ-9 Score 4 - - - -  Difficult doing work/chores Somewhat difficult - - - -   Hypertension: BP  Readings from Last 3 Encounters:  10/14/17 (!) 146/90  04/15/17 130/60  10/06/16 134/78   Obesity: Wt Readings from Last 3 Encounters:  10/14/17 246 lb 12.8 oz (111.9 kg)  04/15/17 238 lb 4.8 oz (108.1 kg)  10/06/16 254 lb 1 oz (115.2 kg)   BMI Readings from Last 3 Encounters:  10/14/17 37.53 kg/m  04/15/17 37.32 kg/m  10/06/16 39.79 kg/m     HIV, hep B, hep C: N/A STD testing and prevention (chl/gon/syphilis): she wants to hold off for now Intimate partner violence: negative Sexual History/Pain during Intercourse: not currently sexually active Menstrual History/LMP/Abnormal Bleeding: one heavy day every 3 months with ocp Incontinence Symptoms: no  Advanced Care Planning: A voluntary discussion about advance care planning including the explanation and discussion of advance directives.  Discussed health care proxy and Living will, and the patient was able to identify a health care proxy as sister - Driante .  Patient does not have a living will at present time. Breast cancer:   Cervical cancer screening: up do date.  BRAC: N/A   Lipids:  Lab Results  Component Value Date   CHOL 192 04/22/2017   CHOL 194 05/05/2016   CHOL 210 (H) 04/18/2015   Lab Results  Component Value Date   HDL 55 04/22/2017   HDL 50 (L) 05/05/2016   HDL 43 04/18/2015   Lab Results  Component Value Date   LDLCALC 114 04/22/2017   LDLCALC 123 (H) 05/05/2016   LDLCALC 147 (H) 04/18/2015   Lab Results  Component Value Date   TRIG 114 04/22/2017   TRIG 103 05/05/2016   TRIG 98 04/18/2015   Lab Results  Component Value Date   CHOLHDL 3.9 05/05/2016   CHOLHDL 4.9 (H) 04/18/2015   No results found for: LDLDIRECT  Glucose:  Glucose  Date Value Ref Range Status  04/18/2015 82 65 - 99 mg/dL Final  02/08/2014 87 65 - 99 mg/dL Final  01/29/2014 78 65 - 99 mg/dL Final  01/20/2014 77 65 - 99 mg/dL Final   Glucose, Bld  Date Value Ref Range Status  05/05/2016 81 65 - 99 mg/dL Final     ECG:2015   Patient Active Problem List   Diagnosis Date Noted  . Vitamin D deficiency 12/13/2014  . Perennial allergic rhinitis 12/13/2014  . Metabolic syndrome 79/89/2119  . Herpes genitalis in women 12/13/2014  . Goiter 12/13/2014  . Dyslipidemia 12/13/2014  . Hematuria, microscopic 12/13/2014  . Hypertension 09/06/2013  . Obesity (BMI 30-39.9) 09/06/2013  . H/O myomectomy 05/14/2013    Past Surgical History:  Procedure Laterality Date  . APPENDECTOMY    . CHOLECYSTECTOMY    . MYOMECTOMY  12.19.2014  . TONSILLECTOMY AND ADENOIDECTOMY N/A 10/15/2015   Procedure: TONSILLECTOMY ;  Surgeon: Carloyn Manner, MD;  Location: [facility];  Service: ENT;  Laterality: N/A;  UPREG    Family History  Problem Relation Age of Onset  . Hypertension Mother   . Diabetes Mother   . COPD Mother   . Stroke Mother   . Hypertension Father   . Heart attack Father   . Hypertension Sister     Social History   Socioeconomic History  . Marital status: Single    Spouse name: Not on file  . Number of children: 0  . Years of education: Not on file  . Highest education level: Associate degree: academic program  Occupational History  . 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 Financial resource strain: Not hard at all  . Food insecurity:    Worry: Never true    Inability: Never true  . Transportation needs:    Medical: No    Non-medical: No  Tobacco Use  . Smoking status: Passive Smoke Exposure - Never Smoker  . Smokeless tobacco: Never Used  Substance and Sexual Activity  . Alcohol use: Yes    Alcohol/week: 0.0 oz    Comment: occasionally  . Drug use: No  . Sexual activity: Not Currently  Lifestyle  . Physical activity:    Days per week: 0 days    Minutes per session: 0 min  . Stress: Only a little  Relationships  . Social connections:    Talks on phone: More than three times a week    Gets together: More than three times a week     Attends religious service: Never    Active member of club or organization: No    Attends meetings of clubs or organizations: Never    Relationship status: Never married  . Intimate partner violence:    Fear of current or ex partner: No    Emotionally abused: No    Physically abused: No    Forced sexual activity:  No  Other Topics Concern  . Not on file  Social History Narrative   She works full time at [employer] day time and part time at night at [employer] from 6-10 pm   She is going to go back to school to become a Therapist, sports     Current Outpatient Medications:  .  azelastine (ASTELIN) 0.1 % nasal spray, Place 2 sprays 2 (two) times daily into both nostrils. Use in each nostril as directed, Disp: 30 mL, Rfl: 2 .  cetirizine (ZYRTEC) 10 MG tablet, Take 10 mg by mouth 2 (two) times daily. Am and pm, Disp: , Rfl:  .  fluticasone (FLONASE) 50 MCG/ACT nasal spray, Place 2 sprays daily into both nostrils., Disp: 16 g, Rfl: 2 .  norethindrone-ethinyl estradiol (NORTREL 0.5/35, 28,) 0.5-35 MG-MCG tablet, Take 1 tablet by mouth daily., Disp: 84 tablet, Rfl: 3 .  triamterene-hydrochlorothiazide (DYAZIDE) 37.5-25 MG capsule, Take 1 each (1 capsule total) by mouth daily., Disp: 90 capsule, Rfl: 0 .  valACYclovir (VALTREX) 500 MG tablet, TAKE 1 TABLET DAILY AND THREE TIMES DAILY AS NEEDED FOR OUTBREAK, Disp: 100 tablet, Rfl: 3  Allergies  Allergen Reactions  . Ace Inhibitors     Per ENT , cough   . Percocet [Oxycodone-Acetaminophen] Itching and Other (See Comments)    Hot Flashes     ROS   Constitutional: Negative for fever or weight change.  Respiratory: Negative for cough and shortness of breath.   Cardiovascular: Negative for chest pain or palpitations.  Gastrointestinal: Negative for abdominal pain, no bowel changes.  Musculoskeletal: Negative for gait problem or joint swelling.  Skin: Negative for rash.  Neurological: Negative for dizziness or headache.  No other specific complaints in a  complete review of systems (except as listed in HPI above).   Objective  Vitals:   10/14/17 1036  BP: (!) 146/90  Pulse: 97  Resp: 16  Temp: 97.9 F (36.6 C)  TempSrc: Oral  SpO2: 98%  Weight: 246 lb 12.8 oz (111.9 kg)  Height: 5\' 8"  (1.727 m)    Body mass index is 37.53 kg/m.  Physical Exam  Constitutional: Patient appears well-developed and obese . No distress.  HENT: Head: Normocephalic and atraumatic. Ears: B TMs ok, no erythema or effusion; Nose: Nose normal. Mouth/Throat: Oropharynx is clear and moist. No oropharyngeal exudate.  Eyes: Conjunctivae and EOM are normal. Pupils are equal, round, and reactive to light. No scleral icterus.  Neck: Normal range of motion. Neck supple. No JVD present. No thyromegaly present.  Cardiovascular: Normal rate, regular rhythm and normal heart sounds.  No murmur heard. No BLE edema. Pulmonary/Chest: Effort normal and breath sounds normal. No respiratory distress. Abdominal: Soft. Bowel sounds are normal, no distension. There is no tenderness. no masses Breast: no lumps or masses, no nipple discharge or rashes FEMALE GENITALIA:  External genitalia normal External urethra normal Pelvic not done RECTAL: not done Musculoskeletal: Normal range of motion, no joint effusions. No gross deformities Neurological: he is alert and oriented to person, place, and time. No cranial nerve deficit. Coordination, balance, strength, speech and gait are normal.  Skin: Skin is warm and dry. No rash noted. No erythema.  Psychiatric: Patient has a normal mood and affect. behavior is normal. Judgment and thought content normal.   Recent Results (from the past 2160 hour(s))  POCT urinalysis dipstick     Status: Abnormal   Collection Time: 10/14/17 10:50 AM  Result Value Ref Range   Color, UA dark yellow  Clarity, UA clear    Glucose, UA neg    Bilirubin, UA neg    Ketones, UA neg    Spec Grav, UA 1.015 1.010 - 1.025   Blood, UA about 50    pH, UA  5.0 5.0 - 8.0   Protein, UA trace    Urobilinogen, UA 0.2 0.2 or 1.0 E.U./dL   Nitrite, UA neg    Leukocytes, UA Negative Negative   Appearance     Odor       PHQ2/9: Depression screen [facility] 2/9 10/14/2017 07/20/2016 12/03/2015 07/17/2015 04/18/2015  Decreased Interest 0 0 0 0 0  Down, Depressed, Hopeless 0 0 0 0 0  PHQ - 2 Score 0 0 0 0 0  Altered sleeping 1 - - - -  Tired, decreased energy 1 - - - -  Change in appetite 0 - - - -  Feeling bad or failure about yourself  1 - - - -  Trouble concentrating 1 - - - -  Moving slowly or fidgety/restless 0 - - - -  Suicidal thoughts 0 - - - -  PHQ-9 Score 4 - - - -  Difficult doing work/chores Somewhat difficult - - - -     Fall Risk: Fall Risk  10/14/2017 04/15/2017 07/20/2016 12/03/2015 07/17/2015  Falls in the past year? No No No No No     Functional Status Survey: Is the patient deaf or have difficulty hearing?: No Does the patient have difficulty seeing, even when wearing glasses/contacts?: Yes Does the patient have difficulty concentrating, remembering, or making decisions?: No Does the patient have difficulty walking or climbing stairs?: No Does the patient have difficulty dressing or bathing?: No Does the patient have difficulty doing errands alone such as visiting a doctor's office or shopping?: No   Assessment & Plan  1. Well woman exam  Discussed importance of 150 minutes of physical activity weekly, eat two servings of fish weekly, eat one serving of tree nuts ( cashews, pistachios, pecans, almonds.Marland Kitchen) every other day, eat 6 servings of fruit/vegetables daily and drink plenty of water and avoid sweet beverages.   2. Urinary frequency  - POCT urinalysis dipstick - CULTURE, URINE COMPREHENSIVE  3. Essential hypertension  - triamterene-hydrochlorothiazide (DYAZIDE) 37.5-25 MG capsule; Take 1 each (1 capsule total) by mouth daily.  Dispense: 90 capsule; Refill: 0 - COMPLETE METABOLIC PANEL WITH GFR  4. Perennial allergic  rhinitis  Continue medications   5. Dyslipidemia  Last labs at goal  6. Metabolic syndrome  Discussed life style modification   7. Obesity (BMI 30-39.9)  Discussed with the patient the risk posed by an increased BMI. Discussed importance of portion control, calorie counting and at least 150 minutes of physical activity weekly. Avoid sweet beverages and drink more water. Eat at least 6 servings of fruit and vegetables daily   8. Vitamin D deficiency  - VITAMIN D 25 Hydroxy (Vit-D Deficiency, Fractures)  9. Herpes genitalis in women  - valACYclovir (VALTREX) 500 MG tablet; TAKE 1 TABLET DAILY AND THREE TIMES DAILY AS NEEDED FOR OUTBREAK  Dispense: 100 tablet; Refill: 3

## 2017-10-14 NOTE — Patient Instructions (Signed)
Preventive Care 18-39 Years, Female Preventive care refers to lifestyle choices and visits with your health care provider that can promote health and wellness. What does preventive care include?  A yearly physical exam. This is also called an annual well check.  Dental exams once or twice a year.  Routine eye exams. Ask your health care provider how often you should have your eyes checked.  Personal lifestyle choices, including: ? Daily care of your teeth and gums. ? Regular physical activity. ? Eating a healthy diet. ? Avoiding tobacco and drug use. ? Limiting alcohol use. ? Practicing safe sex. ? Taking vitamin and mineral supplements as recommended by your health care provider. What happens during an annual well check? The services and screenings done by your health care provider during your annual well check will depend on your age, overall health, lifestyle risk factors, and family history of disease. Counseling Your health care provider may ask you questions about your:  Alcohol use.  Tobacco use.  Drug use.  Emotional well-being.  Home and relationship well-being.  Sexual activity.  Eating habits.  Work and work Statistician.  Method of birth control.  Menstrual cycle.  Pregnancy history.  Screening You may have the following tests or measurements:  Height, weight, and BMI.  Diabetes screening. This is done by checking your blood sugar (glucose) after you have not eaten for a while (fasting).  Blood pressure.  Lipid and cholesterol levels. These may be checked every 5 years starting at age 66.  Skin check.  Hepatitis C blood test.  Hepatitis B blood test.  Sexually transmitted disease (STD) testing.  BRCA-related cancer screening. This may be done if you have a family history of breast, ovarian, tubal, or peritoneal cancers.  Pelvic exam and Pap test. This may be done every 3 years starting at age 40. Starting at age 59, this may be done every 5  years if you have a Pap test in combination with an HPV test.  Discuss your test results, treatment options, and if necessary, the need for more tests with your health care provider. Vaccines Your health care provider may recommend certain vaccines, such as:  Influenza vaccine. This is recommended every year.  Tetanus, diphtheria, and acellular pertussis (Tdap, Td) vaccine. You may need a Td booster every 10 years.  Varicella vaccine. You may need this if you have not been vaccinated.  HPV vaccine. If you are 69 or younger, you may need three doses over 6 months.  Measles, mumps, and rubella (MMR) vaccine. You may need at least one dose of MMR. You may also need a second dose.  Pneumococcal 13-valent conjugate (PCV13) vaccine. You may need this if you have certain conditions and were not previously vaccinated.  Pneumococcal polysaccharide (PPSV23) vaccine. You may need one or two doses if you smoke cigarettes or if you have certain conditions.  Meningococcal vaccine. One dose is recommended if you are age 27-21 years and a first-year college student living in a residence hall, or if you have one of several medical conditions. You may also need additional booster doses.  Hepatitis A vaccine. You may need this if you have certain conditions or if you travel or work in places where you may be exposed to hepatitis A.  Hepatitis B vaccine. You may need this if you have certain conditions or if you travel or work in places where you may be exposed to hepatitis B.  Haemophilus influenzae type b (Hib) vaccine. You may need this if  you have certain risk factors.  Talk to your health care provider about which screenings and vaccines you need and how often you need them. This information is not intended to replace advice given to you by your health care provider. Make sure you discuss any questions you have with your health care provider. Document Released: 07/13/2001 Document Revised: 02/04/2016  Document Reviewed: 03/18/2015 Elsevier Interactive Patient Education  [employer].

## 2017-10-20 LAB — CULTURE, URINE COMPREHENSIVE
MICRO NUMBER: 90603540
RESULT:: NO GROWTH
SPECIMEN QUALITY: ADEQUATE

## 2017-10-20 LAB — COMPLETE METABOLIC PANEL WITH GFR
AG RATIO: 1.2 (calc) (ref 1.0–2.5)
ALT: 14 U/L (ref 6–29)
AST: 18 U/L (ref 10–30)
Albumin: 4.1 g/dL (ref 3.6–5.1)
Alkaline phosphatase (APISO): 49 U/L (ref 33–115)
BILIRUBIN TOTAL: 0.6 mg/dL (ref 0.2–1.2)
BUN: 11 mg/dL (ref 7–25)
CHLORIDE: 102 mmol/L (ref 98–110)
CO2: 26 mmol/L (ref 20–32)
Calcium: 9.1 mg/dL (ref 8.6–10.2)
Creat: 0.75 mg/dL (ref 0.50–1.10)
GFR, Est African American: 120 mL/min/{1.73_m2} (ref >=60)
GFR, Est Non African American: 103 mL/min/{1.73_m2} (ref >=60)
Globulin: 3.3 g/dL (calc) (ref 1.9–3.7)
Glucose, Bld: 75 mg/dL (ref 65–99)
POTASSIUM: 3.7 mmol/L (ref 3.5–5.3)
Sodium: 138 mmol/L (ref 135–146)
Total Protein: 7.4 g/dL (ref 6.1–8.1)

## 2017-10-20 LAB — VITAMIN D 25 HYDROXY (VIT D DEFICIENCY, FRACTURES): Vit D, 25-Hydroxy: 42 ng/mL (ref 30–100)

## 2017-12-23 ENCOUNTER — Ambulatory Visit (INDEPENDENT_AMBULATORY_CARE_PROVIDER_SITE_OTHER): Payer: BLUE CROSS/BLUE SHIELD | Admitting: Nurse Practitioner

## 2017-12-23 ENCOUNTER — Encounter: Payer: Self-pay | Admitting: Nurse Practitioner

## 2017-12-23 VITALS — BP 140/80 | HR 75 | Temp 97.7°F | Resp 16 | Ht 68.0 in | Wt 247.7 lb

## 2017-12-23 DIAGNOSIS — Z789 Other specified health status: Secondary | ICD-10-CM

## 2017-12-23 DIAGNOSIS — Z111 Encounter for screening for respiratory tuberculosis: Secondary | ICD-10-CM

## 2017-12-23 DIAGNOSIS — E8881 Metabolic syndrome: Secondary | ICD-10-CM

## 2017-12-23 DIAGNOSIS — Z130 Encounter for screening for diseases of the blood and blood-forming organs and certain disorders involving the immune mechanism: Secondary | ICD-10-CM | POA: Diagnosis not present

## 2017-12-23 DIAGNOSIS — I10 Essential (primary) hypertension: Secondary | ICD-10-CM

## 2017-12-23 DIAGNOSIS — B353 Tinea pedis: Secondary | ICD-10-CM

## 2017-12-23 MED ORDER — KETOCONAZOLE 2 % EX CREA: 1.0000 "application " | TOPICAL_CREAM | Freq: Two times a day (BID) | CUTANEOUS | 1 refills | Status: DC

## 2017-12-23 MED ORDER — AMLODIPINE BESYLATE 5 MG PO TABS: 5.0000 mg | ORAL_TABLET | Freq: Every day | ORAL | 3 refills | Status: DC

## 2017-12-23 NOTE — Patient Instructions (Addendum)
Goal blood pressure is under 130/70.   DASH Eating Plan DASH stands for "Dietary Approaches to Stop Hypertension." The DASH eating plan is a healthy eating plan that has been shown to reduce high blood pressure (hypertension). It may also reduce your risk for type 2 diabetes, heart disease, and stroke. The DASH eating plan may also help with weight loss. What are tips for following this plan? General guidelines  Avoid eating more than 2,300 mg (milligrams) of salt (sodium) a day. If you have hypertension, you may need to reduce your sodium intake to 1,500 mg a day.  Limit alcohol intake to no more than 1 drink a day for nonpregnant women and 2 drinks a day for men. One drink equals 12 oz of beer, 5 oz of wine, or 1 oz of hard liquor.  Work with your health care provider to maintain a healthy body weight or to lose weight. Ask what an ideal weight is for you.  Get at least 30 minutes of exercise that causes your heart to beat faster (aerobic exercise) most days of the week. Activities may include walking, swimming, or biking.  Work with your health care provider or diet and nutrition specialist (dietitian) to adjust your eating plan to your individual calorie needs. Reading food labels  Check food labels for the amount of sodium per serving. Choose foods with less than 5 percent of the Daily Value of sodium. Generally, foods with less than 300 mg of sodium per serving fit into this eating plan.  To find whole grains, look for the word "whole" as the first word in the ingredient list. Shopping  Buy products labeled as "low-sodium" or "no salt added."  Buy fresh foods. Avoid canned foods and premade or frozen meals. Cooking  Avoid adding salt when cooking. Use salt-free seasonings or herbs instead of table salt or sea salt. Check with your health care provider or pharmacist before using salt substitutes.  Do not fry foods. Cook foods using healthy methods such as baking, boiling,  grilling, and broiling instead.  Cook with heart-healthy oils, such as olive, canola, soybean, or sunflower oil. Meal planning   Eat a balanced diet that includes: ? 5 or more servings of fruits and vegetables each day. At each meal, try to fill half of your plate with fruits and vegetables. ? Up to 6-8 servings of whole grains each day. ? Less than 6 oz of lean meat, poultry, or fish each day. A 3-oz serving of meat is about the same size as a deck of cards. One egg equals 1 oz. ? 2 servings of low-fat dairy each day. ? A serving of nuts, seeds, or beans 5 times each week. ? Heart-healthy fats. Healthy fats called Omega-3 fatty acids are found in foods such as flaxseeds and coldwater fish, like sardines, salmon, and mackerel.  Limit how much you eat of the following: ? Canned or prepackaged foods. ? Food that is high in trans fat, such as fried foods. ? Food that is high in saturated fat, such as fatty meat. ? Sweets, desserts, sugary drinks, and other foods with added sugar. ? Full-fat dairy products.  Do not salt foods before eating.  Try to eat at least 2 vegetarian meals each week.  Eat more home-cooked food and less restaurant, buffet, and fast food.  When eating at a restaurant, ask that your food be prepared with less salt or no salt, if possible. What foods are recommended? The items listed may not be a  complete list. Talk with your dietitian about what dietary choices are best for you. Grains Whole-grain or whole-wheat bread. Whole-grain or whole-wheat pasta. Brown rice. [place]. Bulgur. Whole-grain and low-sodium cereals. Pita bread. Low-fat, low-sodium crackers. Whole-wheat flour tortillas. Vegetables Fresh or frozen vegetables (raw, steamed, roasted, or grilled). Low-sodium or reduced-sodium tomato and vegetable juice. Low-sodium or reduced-sodium tomato sauce and tomato paste. Low-sodium or reduced-sodium canned vegetables. Fruits All fresh, dried, or frozen  fruit. Canned fruit in natural juice (without added sugar). Meat and other protein foods Skinless chicken or [place]. Ground chicken or [place]. Pork with fat trimmed off. Fish and seafood. Egg whites. Dried beans, peas, or lentils. Unsalted nuts, nut butters, and seeds. Unsalted canned beans. Lean cuts of beef with fat trimmed off. Low-sodium, lean deli meat. Dairy Low-fat (1%) or fat-free (skim) milk. Fat-free, low-fat, or reduced-fat cheeses. Nonfat, low-sodium ricotta or cottage cheese. Low-fat or nonfat yogurt. Low-fat, low-sodium cheese. Fats and oils Soft margarine without trans fats. Vegetable oil. Low-fat, reduced-fat, or light mayonnaise and salad dressings (reduced-sodium). Canola, safflower, olive, soybean, and sunflower oils. Avocado. Seasoning and other foods Herbs. Spices. Seasoning mixes without salt. Unsalted popcorn and pretzels. Fat-free sweets. What foods are not recommended? The items listed may not be a complete list. Talk with your dietitian about what dietary choices are best for you. Grains Baked goods made with fat, such as croissants, muffins, or some breads. Dry pasta or rice meal packs. Vegetables Creamed or fried vegetables. Vegetables in a cheese sauce. Regular canned vegetables (not low-sodium or reduced-sodium). Regular canned tomato sauce and paste (not low-sodium or reduced-sodium). Regular tomato and vegetable juice (not low-sodium or reduced-sodium). Angie Fava. Olives. Fruits Canned fruit in a light or heavy syrup. Fried fruit. Fruit in cream or butter sauce. Meat and other protein foods Fatty cuts of meat. Ribs. Fried meat. Berniece Salines. Sausage. Bologna and other processed lunch meats. Salami. Fatback. Hotdogs. Bratwurst. Salted nuts and seeds. Canned beans with added salt. Canned or smoked fish. Whole eggs or egg yolks. Chicken or [place] with skin. Dairy Whole or 2% milk, cream, and half-and-half. Whole or full-fat cream cheese. Whole-fat or sweetened yogurt. Full-fat  cheese. Nondairy creamers. Whipped toppings. Processed cheese and cheese spreads. Fats and oils Butter. Stick margarine. Lard. Shortening. Ghee. Bacon fat. Tropical oils, such as coconut, palm kernel, or palm oil. Seasoning and other foods Salted popcorn and pretzels. Onion salt, garlic salt, seasoned salt, table salt, and sea salt. Worcestershire sauce. Tartar sauce. Barbecue sauce. Teriyaki sauce. Soy sauce, including reduced-sodium. Steak sauce. Canned and packaged gravies. Fish sauce. Oyster sauce. Cocktail sauce. Horseradish that you find on the shelf. Ketchup. Mustard. Meat flavorings and tenderizers. Bouillon cubes. Hot sauce and Tabasco sauce. Premade or packaged marinades. Premade or packaged taco seasonings. Relishes. Regular salad dressings. Where to find more information:  National Heart, Lung, and [place]: https://wilson-eaton.com/  American Heart Association: www.heart.org Summary  The DASH eating plan is a healthy eating plan that has been shown to reduce high blood pressure (hypertension). It may also reduce your risk for type 2 diabetes, heart disease, and stroke.  With the DASH eating plan, you should limit salt (sodium) intake to 2,300 mg a day. If you have hypertension, you may need to reduce your sodium intake to 1,500 mg a day.  When on the DASH eating plan, aim to eat more fresh fruits and vegetables, whole grains, lean proteins, low-fat dairy, and heart-healthy fats.  Work with your health care provider or diet and nutrition specialist (dietitian)  to adjust your eating plan to your individual calorie needs. This information is not intended to replace advice given to you by your health care provider. Make sure you discuss any questions you have with your health care provider. Document Released: 05/06/2011 Document Revised: 05/10/2016 Document Reviewed: 05/10/2016 Elsevier Interactive Patient Education  2018 Elsevier Inc.  

## 2017-12-23 NOTE — Progress Notes (Addendum)
Name: Kelsey Barnes   MRN: [id number]    DOB: 17-Nov-1980   Date:12/23/2017       Progress Note  Subjective  Chief Complaint  Chief Complaint  Patient presents with  . Immunizations  . Form Completion    HPI  Hypertension Taking triamterne 37.'5mg'$  and HCTZ '25mg'$  daily. Patient states is trying to loose weight and is planning on starting keto.  BP Readings from Last 3 Encounters:  12/23/17 140/80  10/14/17 (!) 146/90  04/15/17 130/60   Vitamin D deficiency  2000 IU daily, denies fatigue  Presents today with medical clearance paperwork for college states plans on studying nursing and [employer] in the fall.    Patient Active Problem List   Diagnosis Date Noted  . Vitamin D deficiency 12/13/2014  . Perennial allergic rhinitis 12/13/2014  . Metabolic syndrome 47/82/9562  . Herpes genitalis in women 12/13/2014  . Goiter 12/13/2014  . Dyslipidemia 12/13/2014  . Hematuria, microscopic 12/13/2014  . Hypertension 09/06/2013  . Obesity (BMI 30-39.9) 09/06/2013  . H/O myomectomy 05/14/2013    Past Medical History:  Diagnosis Date  . Class 2 obesity   . Dyslipidemia   . Goiter   . Herpes   . Hypertension    CONTROLLED ON MEDS  . Tonsillar hypertrophy   . Tonsillitis     Past Surgical History:  Procedure Laterality Date  . APPENDECTOMY    . CHOLECYSTECTOMY    . MYOMECTOMY  12.19.2014  . TONSILLECTOMY AND ADENOIDECTOMY N/A 10/15/2015   Procedure: TONSILLECTOMY ;  Surgeon: Carloyn Manner, MD;  Location: [facility];  Service: ENT;  Laterality: N/A;  [place] History   Tobacco Use  . Smoking status: Passive Smoke Exposure - Never Smoker  . Smokeless tobacco: Never Used  Substance Use Topics  . Alcohol use: Yes    Alcohol/week: 0.0 oz    Comment: occasionally     Current Outpatient Medications:  .  azelastine (ASTELIN) 0.1 % nasal spray, Place 2 sprays 2 (two) times daily into both nostrils. Use in each nostril as directed, Disp: 30 mL, Rfl: 2 .   cetirizine (ZYRTEC) 10 MG tablet, Take 10 mg by mouth 2 (two) times daily. Am and pm, Disp: , Rfl:  .  fluticasone (FLONASE) 50 MCG/ACT nasal spray, Place 2 sprays daily into both nostrils., Disp: 16 g, Rfl: 2 .  norethindrone-ethinyl estradiol (NORTREL 0.5/35, 28,) 0.5-35 MG-MCG tablet, Take 1 tablet by mouth daily., Disp: 84 tablet, Rfl: 3 .  triamterene-hydrochlorothiazide (DYAZIDE) 37.5-25 MG capsule, Take 1 each (1 capsule total) by mouth daily., Disp: 90 capsule, Rfl: 0 .  valACYclovir (VALTREX) 500 MG tablet, TAKE 1 TABLET DAILY AND THREE TIMES DAILY AS NEEDED FOR OUTBREAK, Disp: 100 tablet, Rfl: 3  Allergies  Allergen Reactions  . Ace Inhibitors     Per ENT , cough   . Percocet [Oxycodone-Acetaminophen] Itching and Other (See Comments)    Hot Flashes    Review of Systems  Constitutional: Negative for chills and fever.  HENT: Negative for congestion, sinus pain, sore throat and tinnitus.   Eyes: Negative for blurred vision, double vision and pain.  Respiratory: Negative for cough and shortness of breath.   Cardiovascular: Negative for chest pain and palpitations.  Gastrointestinal: Negative for abdominal pain, constipation, diarrhea, nausea and vomiting.  Genitourinary: Negative for dysuria and urgency.  Musculoskeletal: Negative for back pain and myalgias.  Skin: Positive for rash (rash on right foot).  Neurological: Negative for dizziness, tingling, sensory change,  weakness and headaches.  Psychiatric/Behavioral: Negative for depression. The patient is not nervous/anxious and does not have insomnia.      No other specific complaints in a complete review of systems (except as listed in HPI above).  Objective  Vitals:   12/23/17 1454  BP: 140/80  Pulse: 75  Resp: 16  Temp: 97.7 F (36.5 C)  TempSrc: Oral  SpO2: 99%  Weight: 247 lb 11.2 oz (112.4 kg)  Height: '5\' 8"'$  (1.727 m)    Body mass index is 37.66 kg/m.  Nursing Note and Vital Signs reviewed.  Physical  Exam  Constitutional: She appears well-developed and well-nourished.  Eyes: Pupils are equal, round, and reactive to light. EOM are normal.  Neck: Normal range of motion. Neck supple. Carotid bruit is not present.  Cardiovascular: Normal heart sounds and intact distal pulses.  Pulmonary/Chest: Effort normal and breath sounds normal.  Abdominal: Soft. Bowel sounds are normal. There is no tenderness. There is no CVA tenderness.  Neurological: She is alert. She has normal strength.  Reflex Scores:      Patellar reflexes are 2+ on the right side and 2+ on the left side. Skin: Skin is warm, dry and intact.  Psychiatric: She has a normal mood and affect. Her speech is normal and behavior is normal. Judgment normal.  Vitals reviewed. reviewed breast exam from physical 10/14/2017- deferred per patient preference.   No results found for this or any previous visit (from the past 48 hour(s)).  Assessment & Plan  1. Essential hypertension Plans to start keto; we will start on norvasc to control BP and decrease meds as needed when she starts weight loss. Recheck in one month.  - amLODipine (NORVASC) 5 MG tablet; Take 1 tablet (5 mg total) by mouth daily.  Dispense: 90 tablet; Refill: 3  2. Screening for deficiency anemia - needed for school  - Hemoglobin and hematocrit, blood  3. Tinea pedis of right foot - ketoconazole (NIZORAL) 2 % cream; Apply 1 application topically 2 (two) times daily.  Dispense: 15 g; Refill: 1  4. Screening for tuberculosis - QuantiFERON-TB Gold Plus  5. Unknown varicella vaccination status - Varicella zoster antibody, IgG  6. Hepatitis B vaccination status unknown - Hepatitis B Surface AntiGEN - Hepatitis B Surface AntiBODY  7. History of measles, mumps, rubella (MMR) vaccination unknown - Measles/Mumps/Rubella Immunity  8. Morbid obesity ([place]) BMI greater than 35 + metabolic syndrome, dyslipidemia, hypertension Discussed diet, exercise and stress reduction.  Plans to start keto   -Follow up and care instructions discussed and provided in AVS. >25 minutes spend with patient and greater than 50 percent involved in counseling weight management, chronic condition, school prep, hypertension and acute foot infection.   --------------------------------------- I have reviewed this encounter including the documentation in this note and/or discussed this patient with the provider, Suezanne Cheshire DNP AGNP-C. I am certifying that I agree with the content of this note as supervising physician. Enid Derry, [facility] 12/28/2017, 9:52 AM

## 2017-12-26 ENCOUNTER — Other Ambulatory Visit: Payer: Self-pay | Admitting: Nurse Practitioner

## 2017-12-26 LAB — HEMOGLOBIN A1C
EAG (MMOL/L): 5.2 (calc)
Hgb A1c MFr Bld: 4.9 % of total Hgb (ref <5.7)
MEAN PLASMA GLUCOSE: 94 (calc)

## 2017-12-26 LAB — HEPATITIS B SURFACE ANTIGEN: HEP B S AG: NONREACTIVE

## 2017-12-26 LAB — VARICELLA ZOSTER ANTIBODY, IGG: VARICELLA IGG: 1096 {index}

## 2017-12-26 LAB — QUANTIFERON-TB GOLD PLUS
Mitogen-NIL: >10 IU/mL
NIL: 0.07 [IU]/mL
QUANTIFERON-TB GOLD PLUS: NEGATIVE
TB1-NIL: 0.02 IU/mL
TB2-NIL: 0 [IU]/mL

## 2017-12-26 LAB — MEASLES/MUMPS/RUBELLA IMMUNITY
Mumps IgG: 69.8 AU/mL
Rubella: 1.09 index
Rubeola IgG: >300 AU/mL

## 2017-12-26 LAB — HEPATITIS B SURFACE ANTIBODY,QUALITATIVE: HEP B S AB: NONREACTIVE

## 2017-12-27 ENCOUNTER — Telehealth: Payer: Self-pay | Admitting: Nurse Practitioner

## 2017-12-27 ENCOUNTER — Encounter: Payer: Self-pay | Admitting: Nurse Practitioner

## 2017-12-27 DIAGNOSIS — Z23 Encounter for immunization: Secondary | ICD-10-CM | POA: Insufficient documentation

## 2017-12-27 NOTE — Addendum Note (Signed)
Addended by: Chilton Greathouse on: 12/27/2017 02:56 PM   Modules accepted: Orders

## 2017-12-27 NOTE — Telephone Encounter (Signed)
My order for a hemoglobin and hematocrit is listed as still active and a hemoglobin A1C was drawn instead. Please see about this and see about adding the ordered test. It is needed to fill out school paperwork.

## 2017-12-27 NOTE — Telephone Encounter (Signed)
Done

## 2017-12-28 NOTE — Addendum Note (Signed)
Addended by: Fredderick Severance on: 12/28/2017 08:43 AM   Modules accepted: Orders

## 2017-12-29 ENCOUNTER — Ambulatory Visit (INDEPENDENT_AMBULATORY_CARE_PROVIDER_SITE_OTHER): Payer: BLUE CROSS/BLUE SHIELD

## 2017-12-29 ENCOUNTER — Other Ambulatory Visit: Payer: Self-pay

## 2017-12-29 DIAGNOSIS — Z130 Encounter for screening for diseases of the blood and blood-forming organs and certain disorders involving the immune mechanism: Secondary | ICD-10-CM

## 2017-12-29 DIAGNOSIS — Z23 Encounter for immunization: Secondary | ICD-10-CM

## 2017-12-29 LAB — HEMOGLOBIN AND HEMATOCRIT, BLOOD
HEMATOCRIT: 36.6 % (ref 35.0–45.0)
HEMOGLOBIN: 12.2 g/dL (ref 11.7–15.5)

## 2018-01-26 ENCOUNTER — Ambulatory Visit: Payer: BLUE CROSS/BLUE SHIELD | Admitting: Nurse Practitioner

## 2018-02-09 ENCOUNTER — Ambulatory Visit (INDEPENDENT_AMBULATORY_CARE_PROVIDER_SITE_OTHER): Payer: BLUE CROSS/BLUE SHIELD

## 2018-02-09 DIAGNOSIS — Z23 Encounter for immunization: Secondary | ICD-10-CM

## 2018-02-09 NOTE — Progress Notes (Signed)
Hep b vaccine

## 2018-02-13 ENCOUNTER — Other Ambulatory Visit: Payer: Self-pay | Admitting: Family Medicine

## 2018-02-13 DIAGNOSIS — I10 Essential (primary) hypertension: Secondary | ICD-10-CM

## 2018-02-13 MED ORDER — TRIAMTERENE-HCTZ 37.5-25 MG PO CAPS: 1.0000 | ORAL_CAPSULE | Freq: Every day | ORAL | 0 refills | Status: DC

## 2018-02-13 NOTE — Telephone Encounter (Signed)
Refill request was sent to Dr. Krichna Sowles for approval and submission.  

## 2018-02-13 NOTE — Telephone Encounter (Signed)
Hypertension medication request: Triamterene-HCTZ   Last office visit pertaining to hypertension: 10/14/2017   BP Readings from Last 3 Encounters:  12/23/17 140/80  10/14/17 (!) 146/90  04/15/17 130/60    Lab Results  Component Value Date   CREATININE 0.75 10/14/2017   BUN 11 10/14/2017   NA 138 10/14/2017   K 3.7 10/14/2017   CL 102 10/14/2017   CO2 26 10/14/2017     Follow up on 04/14/2018

## 2018-02-13 NOTE — Telephone Encounter (Signed)
Copied from [place] [id number]. Topic: Quick Communication - Rx Refill/Question >> Feb 13, 2018  9:04 AM Alfredia Ferguson R wrote: Medication: triamterene-hydrochlorothiazide (DYAZIDE) 37.5-25 MG capsule  Has the patient contacted their pharmacy? No, would like it sent to local pharmacy instead of mail order due to being out  Preferred Pharmacy (with phone number or street name): [facility] (N), [place] - [phone] (Phone) [phone] (Fax)    Agent: Please be advised that RX refills may take up to 3 business days. We ask that you follow-up with your pharmacy.

## 2018-03-15 ENCOUNTER — Encounter: Payer: Self-pay | Admitting: Family Medicine

## 2018-03-15 ENCOUNTER — Ambulatory Visit (INDEPENDENT_AMBULATORY_CARE_PROVIDER_SITE_OTHER): Payer: BLUE CROSS/BLUE SHIELD | Admitting: Family Medicine

## 2018-03-15 VITALS — BP 138/76 | HR 100 | Temp 98.0°F | Resp 16 | Ht 68.0 in | Wt 237.4 lb

## 2018-03-15 DIAGNOSIS — J069 Acute upper respiratory infection, unspecified: Secondary | ICD-10-CM

## 2018-03-15 DIAGNOSIS — H9203 Otalgia, bilateral: Secondary | ICD-10-CM | POA: Diagnosis not present

## 2018-03-15 DIAGNOSIS — J3089 Other allergic rhinitis: Secondary | ICD-10-CM | POA: Diagnosis not present

## 2018-03-15 DIAGNOSIS — H65192 Other acute nonsuppurative otitis media, left ear: Secondary | ICD-10-CM | POA: Diagnosis not present

## 2018-03-15 MED ORDER — GUAIFENESIN ER 600 MG PO TB12: 600.0000 mg | ORAL_TABLET | Freq: Two times a day (BID) | ORAL | 0 refills | Status: DC | PRN

## 2018-03-15 MED ORDER — FLUTICASONE PROPIONATE 50 MCG/ACT NA SUSP: 2.0000 | Freq: Every day | NASAL | 2 refills | Status: DC

## 2018-03-15 MED ORDER — AZELASTINE HCL 0.1 % NA SOLN: 2.0000 | Freq: Two times a day (BID) | NASAL | 2 refills | Status: DC

## 2018-03-15 NOTE — Patient Instructions (Addendum)
Upper Respiratory Infection, Adult Most upper respiratory infections (URIs) are caused by a virus. A URI affects the nose, throat, and upper air passages. The most common type of URI is often called "the common cold." Follow these instructions at home:  Take medicines only as told by your doctor.  Gargle warm saltwater or take cough drops to comfort your throat as told by your doctor.  Use a warm mist humidifier or inhale steam from a shower to increase air moisture. This may make it easier to breathe.  Drink enough fluid to keep your pee (urine) clear or pale yellow.  Eat soups and other clear broths.  Have a healthy diet.  Rest as needed.  Go back to work when your fever is gone or your doctor says it is okay. ? You may need to stay home longer to avoid giving your URI to others. ? You can also wear a face mask and wash your hands often to prevent spread of the virus.  Use your inhaler more if you have asthma.  Do not use any tobacco products, including cigarettes, chewing tobacco, or electronic cigarettes. If you need help quitting, ask your doctor. Contact a doctor if:  You are getting worse, not better.  Your symptoms are not helped by medicine.  You have chills.  You are getting more short of breath.  You have brown or red mucus.  You have yellow or brown discharge from your nose.  You have pain in your face, especially when you bend forward.  You have a fever.  You have puffy (swollen) neck glands.  You have pain while swallowing.  You have white areas in the back of your throat. Get help right away if:  You have very bad or constant: ? Headache. ? Ear pain. ? Pain in your forehead, behind your eyes, and over your cheekbones (sinus pain). ? Chest pain.  You have long-lasting (chronic) lung disease and any of the following: ? Wheezing. ? Long-lasting cough. ? Coughing up blood. ? A change in your usual mucus.  You have a stiff neck.  You have  changes in your: ? Vision. ? Hearing. ? Thinking. ? Mood. This information is not intended to replace advice given to you by your health care provider. Make sure you discuss any questions you have with your health care provider. Document Released: 11/03/2007 Document Revised: 01/18/2016 Document Reviewed: 08/22/2013 Elsevier Interactive Patient Education  2018 Elsevier Inc.  Cool Mist Vaporizer A cool mist vaporizer is a device that releases a cool mist into the air. If you have a cough or a cold, using a vaporizer may help relieve your symptoms. The mist adds moisture to the air, which may help thin your mucus and make it less sticky. When your mucus is thin and less sticky, it easier for you to breathe and to cough up secretions. Do not use a vaporizer if you are allergic to mold. Follow these instructions at home:  Follow the instructions that come with the vaporizer.  Do not use anything other than distilled water in the vaporizer.  Do not run the vaporizer all of the time. Doing that can cause mold or bacteria to grow in the vaporizer.  Clean the vaporizer after each time that you use it.  Clean and dry the vaporizer well before storing it.  Stop using the vaporizer if your breathing symptoms get worse. This information is not intended to replace advice given to you by your health care provider. Make sure   you discuss any questions you have with your health care provider. Document Released: 02/12/2004 Document Revised: 12/05/2015 Document Reviewed: 08/16/2015 Elsevier Interactive Patient Education  2018 Elsevier Inc.   

## 2018-03-15 NOTE — Progress Notes (Signed)
Name: Kelsey Barnes   MRN: [id number]    DOB: 1981-03-18   Date:03/15/2018       Progress Note  Subjective  Chief Complaint  Chief Complaint  Patient presents with  . Otalgia    cough, congestion  . URI    HPI  Pt presents with concern for URI with bilateral otalgia (worse on the LEFT side).  Endorses nasal congestion, chills, and intermittent cough that is mostly in the mornings.  She has been taking coricidin and zyrtec and these have helped some.  Not using flonase or azelastine at this time - does need refills.  Denies sinus pain/pressure, chest pain, shortness of breath, fevers, NVD.  Patient Active Problem List   Diagnosis Date Noted  . Need for hepatitis B vaccination 12/27/2017  . Vitamin D deficiency 12/13/2014  . Perennial allergic rhinitis 12/13/2014  . Metabolic syndrome 25/42/7062  . Herpes genitalis in women 12/13/2014  . Goiter 12/13/2014  . Dyslipidemia 12/13/2014  . Hematuria, microscopic 12/13/2014  . Hypertension 09/06/2013  . Obesity (BMI 30-39.9) 09/06/2013  . H/O myomectomy 05/14/2013    Social History   Tobacco Use  . Smoking status: Passive Smoke Exposure - Never Smoker  . Smokeless tobacco: Never Used  Substance Use Topics  . Alcohol use: Yes    Alcohol/week: 0.0 standard drinks    Comment: occasionally     Current Outpatient Medications:  .  azelastine (ASTELIN) 0.1 % nasal spray, Place 2 sprays 2 (two) times daily into both nostrils. Use in each nostril as directed, Disp: 30 mL, Rfl: 2 .  cetirizine (ZYRTEC) 10 MG tablet, Take 10 mg by mouth 2 (two) times daily. Am and pm, Disp: , Rfl:  .  fluticasone (FLONASE) 50 MCG/ACT nasal spray, Place 2 sprays daily into both nostrils., Disp: 16 g, Rfl: 2 .  ketoconazole (NIZORAL) 2 % cream, Apply 1 application topically 2 (two) times daily., Disp: 15 g, Rfl: 1 .  norethindrone-ethinyl estradiol (NORTREL 0.5/35, 28,) 0.5-35 MG-MCG tablet, Take 1 tablet by mouth daily., Disp: 84 tablet, Rfl:  3 .  triamterene-hydrochlorothiazide (DYAZIDE) 37.5-25 MG capsule, Take 1 each (1 capsule total) by mouth daily., Disp: 90 capsule, Rfl: 0 .  valACYclovir (VALTREX) 500 MG tablet, TAKE 1 TABLET DAILY AND THREE TIMES DAILY AS NEEDED FOR OUTBREAK, Disp: 100 tablet, Rfl: 3 .  amLODipine (NORVASC) 5 MG tablet, Take 1 tablet (5 mg total) by mouth daily., Disp: 90 tablet, Rfl: 3  Allergies  Allergen Reactions  . Ace Inhibitors     Per ENT , cough   . Percocet [Oxycodone-Acetaminophen] Itching and Other (See Comments)    Hot Flashes    I personally reviewed active problem list, medication list, allergies with the patient/caregiver today.  ROS  Ten systems reviewed and is negative except as mentioned in HPI  Objective  Vitals:   03/15/18 0809  BP: 138/76  Pulse: 100  Resp: 16  Temp: 98 F (36.7 C)  TempSrc: Oral  SpO2: 99%  Weight: 237 lb 6.4 oz (107.7 kg)  Height: 5\' 8"  (1.727 m)     Body mass index is 36.1 kg/m.  Nursing Note and Vital Signs reviewed.  Physical Exam  Constitutional: Patient appears well-developed and well-nourished. Obese. No distress.  HEENT: head atraumatic, normocephalic, pupils equal and reactive to light, R TM has mild erythema and is moderate effusion, L TM is WNL.  Bilateral maxillary and frontal sinuses are non-tender, neck supple without lymphadenopathy, throat within normal limits - no erythema  or exudate, no tonsillar swelling Cardiovascular: Normal rate, regular rhythm and normal heart sounds.  No murmur heard. No BLE edema. Pulmonary/Chest: Effort normal and breath sounds clear bilaterally. No respiratory distress. Psychiatric: Patient has a normal mood and affect. behavior is normal. Judgment and thought content normal.  No results found for this or any previous visit (from the past 72 hour(s)).  Assessment & Plan  1. Upper respiratory tract infection, unspecified type - azelastine (ASTELIN) 0.1 % nasal spray; Place 2 sprays into both  nostrils 2 (two) times daily. Use in each nostril as directed  Dispense: 30 mL; Refill: 2 - fluticasone (FLONASE) 50 MCG/ACT nasal spray; Place 2 sprays into both nostrils daily.  Dispense: 16 g; Refill: 2 - guaiFENesin (MUCINEX) 600 MG 12 hr tablet; Take 1 tablet (600 mg total) by mouth 2 (two) times daily as needed for cough or to loosen phlegm.  Dispense: 20 tablet; Refill: 0  2. Perennial allergic rhinitis - azelastine (ASTELIN) 0.1 % nasal spray; Place 2 sprays into both nostrils 2 (two) times daily. Use in each nostril as directed  Dispense: 30 mL; Refill: 2 - fluticasone (FLONASE) 50 MCG/ACT nasal spray; Place 2 sprays into both nostrils daily.  Dispense: 16 g; Refill: 2  3. Otalgia of both ears 4. Acute effusion of left ear - Recommend watchful waiting - if worsening or not improving by 03/17/2018, we will Rx antibiotic.  Symptoms are moderate and she is approx 6-7 days in to her illness. Start nasal sprays, take mucinex, follow up via telephone in 2 days if needed.  -Red flags and when to present for emergency care or RTC including fever >101.77F, chest pain, shortness of breath, new/worsening/un-resolving symptoms, reviewed with patient at time of visit. Follow up and care instructions discussed and provided in AVS.

## 2018-03-20 ENCOUNTER — Telehealth: Payer: Self-pay | Admitting: Family Medicine

## 2018-03-20 DIAGNOSIS — H6692 Otitis media, unspecified, left ear: Secondary | ICD-10-CM

## 2018-03-20 MED ORDER — AMOXICILLIN-POT CLAVULANATE 875-125 MG PO TABS: 1.0000 | ORAL_TABLET | Freq: Two times a day (BID) | ORAL | 0 refills | Status: AC

## 2018-03-20 NOTE — Telephone Encounter (Signed)
Copied from [place] [id number]. Topic: Quick Communication - See Telephone Encounter >> Mar 20, 2018 11:05 AM Burchel, Abbi R wrote: CRM for notification. See Telephone encounter for: 03/20/18.  Pt states she was instructed to call back in if her respiratory sx did not improve in 5 days after OV Pt state she is not better and is reqeusting that Raelyn Ensign call in the rx that was dicsussed [place] [facility].    Pharmacy:  [facility] [address] ([address]), [place] - [address] [place] ([place]) [place] [address] Phone: [phone] Fax: [phone]  Pt: [phone]

## 2018-03-20 NOTE — Telephone Encounter (Signed)
Augmentin sent in to [facility] for ear infection.

## 2018-04-14 ENCOUNTER — Encounter: Payer: Self-pay | Admitting: Family Medicine

## 2018-04-14 ENCOUNTER — Ambulatory Visit (INDEPENDENT_AMBULATORY_CARE_PROVIDER_SITE_OTHER): Payer: BLUE CROSS/BLUE SHIELD | Admitting: Family Medicine

## 2018-04-14 VITALS — BP 136/84 | HR 85 | Temp 97.8°F | Resp 16 | Ht 68.0 in | Wt 245.0 lb

## 2018-04-14 DIAGNOSIS — Z23 Encounter for immunization: Secondary | ICD-10-CM

## 2018-04-14 DIAGNOSIS — E559 Vitamin D deficiency, unspecified: Secondary | ICD-10-CM

## 2018-04-14 DIAGNOSIS — E8881 Metabolic syndrome: Secondary | ICD-10-CM | POA: Diagnosis not present

## 2018-04-14 DIAGNOSIS — I10 Essential (primary) hypertension: Secondary | ICD-10-CM | POA: Diagnosis not present

## 2018-04-14 DIAGNOSIS — B353 Tinea pedis: Secondary | ICD-10-CM

## 2018-04-14 DIAGNOSIS — E785 Hyperlipidemia, unspecified: Secondary | ICD-10-CM

## 2018-04-14 MED ORDER — TERBINAFINE 1 % EX GEL: 1.0000 g | Freq: Every day | CUTANEOUS | 0 refills | Status: DC

## 2018-04-14 MED ORDER — TRIAMTERENE-HCTZ 37.5-25 MG PO CAPS: 1.0000 | ORAL_CAPSULE | Freq: Every day | ORAL | 1 refills | Status: DC

## 2018-04-14 NOTE — Progress Notes (Signed)
Name: Kelsey Barnes   MRN: [id number]    DOB: [birth year]   Date:04/14/2018       Progress Note  Subjective  Chief Complaint  Chief Complaint  Patient presents with  . Medication Refill  . Hypertension    Denies any symptoms  . Obesity  . Dyslipidemia  . Allergic Rhinitis     Stable    HPI  HTN: she is off ACE she was advised by ENT to stop and is now on Triamterene/hctz and also on Norvasc 5 mg, bp is controlled. No chest pain or palpitation. Denies edema   AR: she has two nasal sprays and takes Zyrtec at home, she states she has some rhinorrhea and sneezing but stable.   Herpes : no outbreaks as long as she takes medication . Unchanged   Metabolic syndrome/obesity : last labs were normal, she has been eating only once a day, usually at night, she eats fast food daily. She drinks sodas and tea once a day, drinking more water now, she has not been exercising, only at work   Tinea Pedis: tried ketoconazole without help she states used another topical in the past that worked well for her, rash on toe web and at times itchy  Dyslipidemia: improved on last labs she wants to hold off to do labs on her next visit    Patient Active Problem List   Diagnosis Date Noted  . Need for hepatitis B vaccination 12/27/2017  . Vitamin D deficiency 12/13/2014  . Perennial allergic rhinitis 12/13/2014  . Metabolic syndrome 84/69/6295  . Herpes genitalis in women 12/13/2014  . Goiter 12/13/2014  . Dyslipidemia 12/13/2014  . Hematuria, microscopic 12/13/2014  . Hypertension 09/06/2013  . Obesity (BMI 30-39.9) 09/06/2013  . H/O myomectomy 05/14/2013    Past Surgical History:  Procedure Laterality Date  . APPENDECTOMY    . CHOLECYSTECTOMY    . MYOMECTOMY  12.19.2014  . TONSILLECTOMY AND ADENOIDECTOMY N/A 10/15/2015   Procedure: TONSILLECTOMY ;  Surgeon: Carloyn Manner, MD;  Location: [facility];  Service: ENT;  Laterality: N/A;  UPREG    Family History  Problem  Relation Age of Onset  . Hypertension Mother   . Diabetes Mother   . COPD Mother   . Stroke Mother   . Hypertension Father   . Heart attack Father   . Hypertension Sister     Social History   Socioeconomic History  . Marital status: Single    Spouse name: Not on file  . Number of children: 0  . Years of education: Not on file  . Highest education level: Associate degree: academic program  Occupational History  . 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 Financial resource strain: Not hard at all  . Food insecurity:    Worry: Never true    Inability: Never true  . Transportation needs:    Medical: No    Non-medical: No  Tobacco Use  . Smoking status: Passive Smoke Exposure - Never Smoker  . Smokeless tobacco: Never Used  Substance and Sexual Activity  . Alcohol use: Yes    Alcohol/week: 0.0 standard drinks    Comment: occasionally  . Drug use: No  . Sexual activity: Not Currently  Lifestyle  . Physical activity:    Days per week: 0 days    Minutes per session: 0 min  . Stress: Only a little  Relationships  . Social connections:    Talks on phone: More  than three times a week    Gets together: More than three times a week    Attends religious service: Never    Active member of club or organization: No    Attends meetings of clubs or organizations: Never    Relationship status: Never married  . Intimate partner violence:    Fear of current or ex partner: No    Emotionally abused: No    Physically abused: No    Forced sexual activity: No  Other Topics Concern  . 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  amLODipine (NORVASC) 5 MG tablet, Take 1 tablet (5 mg total) by mouth daily., Disp: 90 tablet, Rfl: 3 .  azelastine (ASTELIN)  0.1 % nasal spray, Place 2 sprays into both nostrils 2 (two) times daily. Use in each nostril as directed, Disp: 30 mL, Rfl: 2 .  cetirizine (ZYRTEC) 10 MG tablet, Take 10 mg by mouth 2 (two) times daily. Am and pm, Disp: , Rfl:  .  fluticasone (FLONASE) 50 MCG/ACT nasal spray, Place 2 sprays into both nostrils daily., Disp: 16 g, Rfl: 2 .  norethindrone-ethinyl estradiol (NORTREL 0.5/35, 28,) 0.5-35 MG-MCG tablet, Take 1 tablet by mouth daily., Disp: 84 tablet, Rfl: 3 .  triamterene-hydrochlorothiazide (DYAZIDE) 37.5-25 MG capsule, Take 1 each (1 capsule total) by mouth daily., Disp: 90 capsule, Rfl: 1 .  valACYclovir (VALTREX) 500 MG tablet, TAKE 1 TABLET DAILY AND THREE TIMES DAILY AS NEEDED FOR OUTBREAK, Disp: 100 tablet, Rfl: 3 .  Terbinafine 1 % GEL, Apply 1 g topically daily., Disp: 12 g, Rfl: 0  Allergies  Allergen Reactions  . Ace Inhibitors     Per ENT , cough   . Percocet [Oxycodone-Acetaminophen] Itching and Other (See Comments)    Hot Flashes    I personally reviewed active problem list, medication list, allergies, family history, social history with the patient/caregiver today.   ROS  Constitutional: Negative for fever, positive for weight change.  Respiratory: Negative for cough and shortness of breath.   Cardiovascular: Negative for chest pain or palpitations.  Gastrointestinal: Negative for abdominal pain, no bowel changes.  Musculoskeletal: Negative for gait problem or joint swelling.  Skin: positive  for rash.  Neurological: Negative for dizziness or headache.  No other specific complaints in a complete review of systems (except as listed in HPI above).  Objective  Vitals:   04/14/18 1513  BP: 136/84  Pulse: 85  Resp: 16  Temp: 97.8 F (36.6 C)  TempSrc: Oral  SpO2: 99%  Weight: 245 lb (111.1 kg)  Height: 5\' 8"  (1.727 m)    Body mass index is 37.25 kg/m.  Physical Exam  Constitutional: Patient appears well-developed and well-nourished. Obese  No  distress.  HEENT: head atraumatic, normocephalic, pupils equal and reactive to light, neck supple, throat within normal limits Cardiovascular: Normal rate, regular rhythm and normal heart sounds.  No murmur heard. No BLE edema. Pulmonary/Chest: Effort normal and breath sounds normal. No respiratory distress. Abdominal: Soft.  There is no tenderness. Skin: maceration on 4th toe web  Psychiatric: Patient has a normal mood and affect. behavior is normal. Judgment and thought content normal.  PHQ2/9: Depression screen [facility] 2/9 04/14/2018 03/15/2018 10/14/2017 07/20/2016  12/03/2015  Decreased Interest 0 0 0 0 0  Down, Depressed, Hopeless 0 0 0 0 0  PHQ - 2 Score 0 0 0 0 0  Altered sleeping 0 0 1 - -  Tired, decreased energy 0 0 1 - -  Change in appetite 0 0 0 - -  Feeling bad or failure about yourself  0 0 1 - -  Trouble concentrating 0 0 1 - -  Moving slowly or fidgety/restless 0 0 0 - -  Suicidal thoughts 0 0 0 - -  PHQ-9 Score 0 0 4 - -  Difficult doing work/chores Not difficult at all Not difficult at all Somewhat difficult - -     Fall Risk: Fall Risk  04/14/2018 03/15/2018 12/23/2017 10/14/2017 04/15/2017  Falls in the past year? 0 No No No No  Number falls in past yr: 0 - - - -  Injury with Fall? 0 - - - -     Assessment & Plan   1. Essential hypertension  - triamterene-hydrochlorothiazide (DYAZIDE) 37.5-25 MG capsule; Take 1 each (1 capsule total) by mouth daily.  Dispense: 90 capsule; Refill: 1  2. Need for immunization against influenza  - Flu Vaccine QUAD 6+ mos PF IM (Fluarix Quad PF)  3. Tinea pedis of right foot  - Terbinafine 1 % GEL; Apply 1 g topically daily.  Dispense: 12 g; Refill: 0  4. Morbid obesity ([place])  Based on BMI above 35 and co-morbidities explained importance of weight loss  Discussed with the patient the risk posed by an increased BMI. Discussed importance of portion control, calorie counting and at least 150 minutes of physical activity weekly.  Avoid sweet beverages and drink more water. Eat at least 6 servings of fruit and vegetables daily   5. Vitamin D deficiency   6. Metabolic syndrome  Last [id number] was normal   7. Dyslipidemia  We will recheck next visit

## 2018-04-19 ENCOUNTER — Telehealth: Payer: Self-pay | Admitting: Family Medicine

## 2018-04-19 MED ORDER — TERBINAFINE HCL 1 % EX CREA: 1.0000 "application " | TOPICAL_CREAM | Freq: Two times a day (BID) | CUTANEOUS | 0 refills | Status: DC

## 2018-04-19 NOTE — Telephone Encounter (Signed)
Copied from [place] [id number]. Topic: Quick Communication - See Telephone Encounter >> Apr 19, 2018  9:30 AM Ahmed Prima L wrote: CRM for notification. See Telephone encounter for: 04/19/18.  Pam with [facility] called and stated they they only have the Terbinafine 1 % cream and not the GEL. Can it be changed to cream instead of gel? Also, she needs to clarify the directions. Please call back , as it is easier. [phone] option 2. Reference number [id number].

## 2018-05-03 ENCOUNTER — Other Ambulatory Visit: Payer: Self-pay | Admitting: Family Medicine

## 2018-05-03 DIAGNOSIS — I10 Essential (primary) hypertension: Secondary | ICD-10-CM

## 2018-07-12 ENCOUNTER — Ambulatory Visit: Payer: BLUE CROSS/BLUE SHIELD

## 2018-07-12 DIAGNOSIS — Z789 Other specified health status: Secondary | ICD-10-CM

## 2018-07-12 NOTE — Progress Notes (Signed)
Patient came in to get her TD. Forms from school stated that she needed 3 TD. Spoke with school told them that she was UTD on her immunizations and she didn't need 3 Tetanus vaccines. She also needed her 3rd Hep B Vaccine, I recommended that she go to [facility] for her last Hep B since we had changed brands of Hep B. If she got her hep B her instead of her being complete she would need 1 more with the new vaccine that we have now. She will go to [place] and finish her series.

## 2018-09-28 ENCOUNTER — Other Ambulatory Visit: Payer: Self-pay | Admitting: Family Medicine

## 2018-09-28 DIAGNOSIS — I10 Essential (primary) hypertension: Secondary | ICD-10-CM

## 2018-09-28 NOTE — Telephone Encounter (Signed)
Refill request for Hypertension medication:  Dyazide 37.5-25 mg  Last office visit pertaining to hypertension: 04/14/2018  BP Readings from Last 3 Encounters:  04/14/18 136/84  03/15/18 138/76  12/23/17 140/80   Lab Results  Component Value Date   CREATININE 0.75 10/14/2017   BUN 11 10/14/2017   NA 138 10/14/2017   K 3.7 10/14/2017   CL 102 10/14/2017   CO2 26 10/14/2017   Follow-ups on file. 10/12/2018

## 2018-10-12 ENCOUNTER — Ambulatory Visit: Payer: BLUE CROSS/BLUE SHIELD | Admitting: Family Medicine

## 2018-10-13 ENCOUNTER — Ambulatory Visit: Payer: BLUE CROSS/BLUE SHIELD | Admitting: Family Medicine

## 2018-10-18 ENCOUNTER — Ambulatory Visit (INDEPENDENT_AMBULATORY_CARE_PROVIDER_SITE_OTHER): Payer: BLUE CROSS/BLUE SHIELD | Admitting: Family Medicine

## 2018-10-18 ENCOUNTER — Other Ambulatory Visit: Payer: Self-pay

## 2018-10-18 ENCOUNTER — Encounter: Payer: Self-pay | Admitting: Family Medicine

## 2018-10-18 VITALS — BP 140/84 | HR 98 | Temp 98.4°F | Resp 12 | Ht 65.0 in | Wt 262.3 lb

## 2018-10-18 DIAGNOSIS — E559 Vitamin D deficiency, unspecified: Secondary | ICD-10-CM

## 2018-10-18 DIAGNOSIS — I10 Essential (primary) hypertension: Secondary | ICD-10-CM

## 2018-10-18 DIAGNOSIS — E8881 Metabolic syndrome: Secondary | ICD-10-CM | POA: Diagnosis not present

## 2018-10-18 DIAGNOSIS — E785 Hyperlipidemia, unspecified: Secondary | ICD-10-CM | POA: Diagnosis not present

## 2018-10-18 DIAGNOSIS — A6009 Herpesviral infection of other urogenital tract: Secondary | ICD-10-CM

## 2018-10-18 DIAGNOSIS — J069 Acute upper respiratory infection, unspecified: Secondary | ICD-10-CM

## 2018-10-18 DIAGNOSIS — J3089 Other allergic rhinitis: Secondary | ICD-10-CM

## 2018-10-18 DIAGNOSIS — Z3041 Encounter for surveillance of contraceptive pills: Secondary | ICD-10-CM

## 2018-10-18 MED ORDER — FLUTICASONE PROPIONATE 50 MCG/ACT NA SUSP: 2.0000 | Freq: Every day | NASAL | 0 refills | Status: DC

## 2018-10-18 MED ORDER — AZELASTINE HCL 0.1 % NA SOLN: 2.0000 | Freq: Two times a day (BID) | NASAL | 0 refills | Status: DC

## 2018-10-18 MED ORDER — NORETHINDRONE-ETH ESTRADIOL 0.5-35 MG-MCG PO TABS: 1.0000 | ORAL_TABLET | Freq: Every day | ORAL | 0 refills | Status: DC

## 2018-10-18 MED ORDER — VALACYCLOVIR HCL 500 MG PO TABS: ORAL_TABLET | ORAL | 3 refills | Status: DC

## 2018-10-18 NOTE — Progress Notes (Signed)
Name: Kelsey Barnes   MRN: [id number]    DOB: August 26, 1980   Date:10/18/2018       Progress Note  Subjective  Chief Complaint  Chief Complaint  Patient presents with  . Medication Refill  . Hypertension  . Obesity  . Allergic Rhinitis   . Herpes  . Dyslipidemia    HPI  HTN: she is off ACE she was advised by ENT to stop and is now on Triamterene/hctz and also on Norvasc 5 mg, bp today is 140, explained because of her age recommend it to be a little lower, we will recheck on her CPE in a few weeks and adjust dose of Norvasc if needed. No chest pain or palpitation   AR: she has two nasal sprays and takes Zyrtec at home, however she is not using nasal sprays lately, states she has recurrent left sinus pressure, had a facial injury many years ago. Explained we will try resuming nasal spray and refer back to ENT if needed.   Herpes : no outbreaks as long as she takes medication . Unchanged  Metabolic syndrome/obesity : last labs were normal, she states her diet has not been good lately, she lives by herself now ( mother died 06-30-18), she has been eating fast food instead of cooking at home  Dyslipidemia: I we will recheck labs today  Contraception: explained needs to use condoms, risk of DVT and also we need to get bp better controlled   Patient Active Problem List   Diagnosis Date Noted  . Need for hepatitis B vaccination 12/27/2017  . Vitamin D deficiency 12/13/2014  . Perennial allergic rhinitis 12/13/2014  . Metabolic syndrome 96/28/3662  . Herpes genitalis in women 12/13/2014  . Goiter 12/13/2014  . Dyslipidemia 12/13/2014  . Hematuria, microscopic 12/13/2014  . Hypertension 09/06/2013  . Obesity (BMI 30-39.9) 09/06/2013  . H/O myomectomy 05/14/2013    Past Surgical History:  Procedure Laterality Date  . APPENDECTOMY    . CHOLECYSTECTOMY    . MYOMECTOMY  12.19.2014  . TONSILLECTOMY AND ADENOIDECTOMY N/A 10/15/2015   Procedure: TONSILLECTOMY ;  Surgeon:  Carloyn Manner, MD;  Location: [place];  Service: ENT;  Laterality: N/A;  UPREG    Family History  Problem Relation Age of Onset  . Hypertension Mother   . Diabetes Mother   . COPD Mother   . Stroke Mother   . Hypertension Father   . Heart attack Father   . Hypertension Sister     Social History   Socioeconomic History  . Marital status: Single    Spouse name: Not on file  . Number of children: 0  . Years of education: Not on file  . Highest education level: Associate degree: academic program  Occupational History  . 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 Financial resource strain: Not hard at all  . Food insecurity:    Worry: Never true    Inability: Never true  . Transportation needs:    Medical: No    Non-medical: No  Tobacco Use  . Smoking status: Passive Smoke Exposure - Never Smoker  . Smokeless tobacco: Never Used  Substance and Sexual Activity  . Alcohol use: Yes    Alcohol/week: 0.0 standard drinks    Comment: occasionally  . Drug use: No  . Sexual activity: Not Currently  Lifestyle  . Physical activity:    Days per week: 0 days    Minutes per session: 0 min  .  Stress: Only a little  Relationships  . Social connections:    Talks on phone: More than three times a week    Gets together: More than three times a week    Attends religious service: Never    Active member of club or organization: No    Attends meetings of clubs or organizations: Never    Relationship status: Never married  . Intimate partner violence:    Fear of current or ex partner: No    Emotionally abused: No    Physically abused: No    Forced sexual activity: No  Other Topics Concern  . 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  amLODipine (NORVASC) 5 MG tablet, Take 1 tablet (5 mg total) by  mouth daily., Disp: 90 tablet, Rfl: 3 .  azelastine (ASTELIN) 0.1 % nasal spray, Place 2 sprays into both nostrils 2 (two) times daily. Use in each nostril as directed, Disp: 30 mL, Rfl: 2 .  cetirizine (ZYRTEC) 10 MG tablet, Take 10 mg by mouth 2 (two) times daily. Am and pm, Disp: , Rfl:  .  fluticasone (FLONASE) 50 MCG/ACT nasal spray, Place 2 sprays into both nostrils daily., Disp: 16 g, Rfl: 2 .  norethindrone-ethinyl estradiol (NORTREL 0.5/35, 28,) 0.5-35 MG-MCG tablet, Take 1 tablet by mouth daily., Disp: 84 tablet, Rfl: 3 .  terbinafine (LAMISIL) 1 % cream, Apply 1 application topically 2 (two) times daily., Disp: 30 g, Rfl: 0 .  triamterene-hydrochlorothiazide (DYAZIDE) 37.5-25 MG capsule, TAKE 1 CAPSULE DAILY, Disp: 90 capsule, Rfl: 0 .  valACYclovir (VALTREX) 500 MG tablet, TAKE 1 TABLET DAILY AND THREE TIMES DAILY AS NEEDED FOR OUTBREAK, Disp: 100 tablet, Rfl: 3  Allergies  Allergen Reactions  . Ace Inhibitors     Per ENT , cough   . Percocet [Oxycodone-Acetaminophen] Itching and Other (See Comments)    Hot Flashes    I personally reviewed active problem list, medication list, allergies, family history, social history with the patient/caregiver today.   ROS  Constitutional: Negative for fever, positive for  weight change.  Respiratory: Negative for cough and shortness of breath.   Cardiovascular: Negative for chest pain or palpitations.  Gastrointestinal: Negative for abdominal pain, no bowel changes.  Musculoskeletal: Negative for gait problem or joint swelling.  Skin: Negative for rash.  Neurological: Negative for dizziness or headache.  No other specific complaints in a complete review of systems (except as listed in HPI above).  Objective  Vitals:   10/18/18 1431  BP: 140/84  Pulse: 98  Resp: 12  Temp: 98.4 F (36.9 C)  TempSrc: Oral  SpO2: 98%  Weight: 262 lb 4.8 oz (119 kg)  Height: 5\' 5"  (1.651 m)    Body mass index is 43.65 kg/m.  Physical  Exam  Constitutional: Patient appears well-developed and well-nourished. Obese  No distress.  HEENT: head atraumatic, normocephalic, pupils equal and reactive to light, ears normal TM bilaterally, neck supple, throat within normal limits Cardiovascular: Normal rate, regular rhythm and normal heart sounds.  No murmur heard. No BLE edema. Pulmonary/Chest: Effort normal and breath sounds normal. No respiratory distress. Abdominal: Soft.  There is no tenderness. Psychiatric: Patient has a normal mood and affect. behavior is normal. Judgment and thought content normal.   PHQ2/9: Depression screen [facility] 2/9 10/18/2018 04/14/2018 03/15/2018  10/14/2017 07/20/2016  Decreased Interest 0 0 0 0 0  Down, Depressed, Hopeless 0 0 0 0 0  PHQ - 2 Score 0 0 0 0 0  Altered sleeping 0 0 0 1 -  Tired, decreased energy 0 0 0 1 -  Change in appetite 0 0 0 0 -  Feeling bad or failure about yourself  0 0 0 1 -  Trouble concentrating 0 0 0 1 -  Moving slowly or fidgety/restless 0 0 0 0 -  Suicidal thoughts 0 0 0 0 -  PHQ-9 Score 0 0 0 4 -  Difficult doing work/chores Not difficult at all Not difficult at all Not difficult at all Somewhat difficult -    phq 9 is negative   Fall Risk: Fall Risk  10/18/2018 04/14/2018 03/15/2018 12/23/2017 10/14/2017  Falls in the past year? 0 0 No No No  Number falls in past yr: 0 0 - - -  Injury with Fall? 0 0 - - -    Functional Status Survey: Is the patient deaf or have difficulty hearing?: No Does the patient have difficulty seeing, even when wearing glasses/contacts?: No Does the patient have difficulty concentrating, remembering, or making decisions?: No Does the patient have difficulty walking or climbing stairs?: No Does the patient have difficulty dressing or bathing?: No Does the patient have difficulty doing errands alone such as visiting a doctor's office or shopping?: No    Assessment & Plan   1. Essential hypertension  - COMPLETE METABOLIC PANEL WITH  GFR - CBC with Differential/Platelet  2. Morbid obesity ([place])  Discussed with the patient the risk posed by an increased BMI. Discussed importance of portion control, calorie counting and at least 150 minutes of physical activity weekly. Avoid sweet beverages and drink more water. Eat at least 6 servings of fruit and vegetables daily   3. Vitamin D deficiency  - VITAMIN D 25 Hydroxy (Vit-D Deficiency, Fractures)  4. Metabolic syndrome  - Hemoglobin A1c  5. Dyslipidemia  - Lipid panel  6. Perennial allergic rhinitis  - fluticasone (FLONASE) 50 MCG/ACT nasal spray; Place 2 sprays into both nostrils daily.  Dispense: 48 g; Refill: 0 - azelastine (ASTELIN) 0.1 % nasal spray; Place 2 sprays into both nostrils 2 (two) times daily. Use in each nostril as directed  Dispense: 90 mL; Refill: 0  7. Herpes genitalis in women  - valACYclovir (VALTREX) 500 MG tablet; TAKE 1 TABLET DAILY AND THREE TIMES DAILY AS NEEDED FOR OUTBREAK  Dispense: 100 tablet; Refill: 3   8. Encounter for surveillance of contraceptive pills  - norethindrone-ethinyl estradiol (NORTREL 0.5/35, 28,) 0.5-35 MG-MCG tablet; Take 1 tablet by mouth daily.  Dispense: 84 tablet; Refill: 0

## 2018-10-19 LAB — CBC WITH DIFFERENTIAL/PLATELET
Absolute Monocytes: 472 cells/uL (ref 200–950)
Basophils Absolute: 48 cells/uL (ref 0–200)
Basophils Relative: 0.6 %
Eosinophils Absolute: 72 cells/uL (ref 15–500)
Eosinophils Relative: 0.9 %
HCT: 38.5 % (ref 35.0–45.0)
Hemoglobin: 12.5 g/dL (ref 11.7–15.5)
Lymphs Abs: 2632 cells/uL (ref 850–3900)
MCH: 27.8 pg (ref 27.0–33.0)
MCHC: 32.5 g/dL (ref 32.0–36.0)
MCV: 85.6 fL (ref 80.0–100.0)
MPV: 10.6 fL (ref 7.5–12.5)
Monocytes Relative: 5.9 %
Neutro Abs: 4776 cells/uL (ref 1500–7800)
Neutrophils Relative %: 59.7 %
Platelets: 402 10*3/uL — ABNORMAL HIGH (ref 140–400)
RBC: 4.5 10*6/uL (ref 3.80–5.10)
RDW: 12.2 % (ref 11.0–15.0)
Total Lymphocyte: 32.9 %
WBC: 8 10*3/uL (ref 3.8–10.8)

## 2018-10-19 LAB — COMPLETE METABOLIC PANEL WITH GFR
AG Ratio: 1.2 (calc) (ref 1.0–2.5)
ALT: 18 U/L (ref 6–29)
AST: 19 U/L (ref 10–30)
Albumin: 4.1 g/dL (ref 3.6–5.1)
Alkaline phosphatase (APISO): 47 U/L (ref 31–125)
BUN: 11 mg/dL (ref 7–25)
CO2: 26 mmol/L (ref 20–32)
Calcium: 9.4 mg/dL (ref 8.6–10.2)
Chloride: 102 mmol/L (ref 98–110)
Creat: 0.77 mg/dL (ref 0.50–1.10)
GFR, Est African American: 115 mL/min/{1.73_m2} (ref >=60)
GFR, Est Non African American: 99 mL/min/{1.73_m2} (ref >=60)
Globulin: 3.4 g/dL (calc) (ref 1.9–3.7)
Glucose, Bld: 68 mg/dL (ref 65–99)
Potassium: 3.6 mmol/L (ref 3.5–5.3)
Sodium: 137 mmol/L (ref 135–146)
Total Bilirubin: 0.4 mg/dL (ref 0.2–1.2)
Total Protein: 7.5 g/dL (ref 6.1–8.1)

## 2018-10-19 LAB — HEMOGLOBIN A1C
Hgb A1c MFr Bld: 4.9 % of total Hgb (ref <5.7)
Mean Plasma Glucose: 94 (calc)
eAG (mmol/L): 5.2 (calc)

## 2018-10-19 LAB — LIPID PANEL
Cholesterol: 223 mg/dL — ABNORMAL HIGH (ref <200)
HDL: 57 mg/dL (ref >=50)
LDL Cholesterol (Calc): 142 mg/dL (calc) — ABNORMAL HIGH
Non-HDL Cholesterol (Calc): 166 mg/dL (calc) — ABNORMAL HIGH (ref <130)
Total CHOL/HDL Ratio: 3.9 (calc) (ref <5.0)
Triglycerides: 122 mg/dL (ref <150)

## 2018-10-19 LAB — VITAMIN D 25 HYDROXY (VIT D DEFICIENCY, FRACTURES): Vit D, 25-Hydroxy: 40 ng/mL (ref 30–100)

## 2018-10-20 ENCOUNTER — Other Ambulatory Visit: Payer: Self-pay | Admitting: Family Medicine

## 2018-10-20 DIAGNOSIS — A6009 Herpesviral infection of other urogenital tract: Secondary | ICD-10-CM

## 2018-10-20 MED ORDER — VALACYCLOVIR HCL 500 MG PO TABS: ORAL_TABLET | ORAL | 3 refills | Status: DC

## 2018-10-25 ENCOUNTER — Telehealth: Payer: Self-pay | Admitting: Family Medicine

## 2018-10-25 NOTE — Telephone Encounter (Signed)
Copied from [place] [phone]. Topic: Quick Communication - See Telephone Encounter >> Oct 25, 2018  9:35 AM Ivar Drape wrote: CRM for notification. See Telephone encounter for: 10/25/18. Robin w/[facility] [phone] would like to know how many tablets a day can the patient take with the valACYclovir (VALTREX) 500 MG tablet medication.

## 2018-10-25 NOTE — Telephone Encounter (Signed)
Called pharmacy to infom them that. She can take it once daily and up to 3 times a day for 7 days if there is a flare.

## 2018-11-15 ENCOUNTER — Encounter: Payer: Self-pay | Admitting: Family Medicine

## 2018-11-15 ENCOUNTER — Ambulatory Visit (INDEPENDENT_AMBULATORY_CARE_PROVIDER_SITE_OTHER): Payer: BC Managed Care – PPO | Admitting: Family Medicine

## 2018-11-15 ENCOUNTER — Other Ambulatory Visit: Payer: Self-pay

## 2018-11-15 VITALS — BP 134/86 | HR 103 | Temp 98.3°F | Resp 16 | Ht 68.0 in | Wt 258.1 lb

## 2018-11-15 DIAGNOSIS — Z01419 Encounter for gynecological examination (general) (routine) without abnormal findings: Secondary | ICD-10-CM

## 2018-11-15 NOTE — Patient Instructions (Signed)
First two weeks eat 20 g of carb After that 30 g of carb  At most 50 g at any time  Preventive Care 18-39 Years, Female Preventive care refers to lifestyle choices and visits with your health care provider that can promote health and wellness. What does preventive care include?   A yearly physical exam. This is also called an annual well check.  Dental exams once or twice a year.  Routine eye exams. Ask your health care provider how often you should have your eyes checked.  Personal lifestyle choices, including: ? Daily care of your teeth and gums. ? Regular physical activity. ? Eating a healthy diet. ? Avoiding tobacco and drug use. ? Limiting alcohol use. ? Practicing safe sex. ? Taking vitamin and mineral supplements as recommended by your health care provider. What happens during an annual well check? The services and screenings done by your health care provider during your annual well check will depend on your age, overall health, lifestyle risk factors, and family history of disease. Counseling Your health care provider may ask you questions about your:  Alcohol use.  Tobacco use.  Drug use.  Emotional well-being.  Home and relationship well-being.  Sexual activity.  Eating habits.  Work and work Statistician.  Method of birth control.  Menstrual cycle.  Pregnancy history. Screening You may have the following tests or measurements:  Height, weight, and BMI.  Diabetes screening. This is done by checking your blood sugar (glucose) after you have not eaten for a while (fasting).  Blood pressure.  Lipid and cholesterol levels. These may be checked every 5 years starting at age 11.  Skin check.  Hepatitis C blood test.  Hepatitis B blood test.  Sexually transmitted disease (STD) testing.  BRCA-related cancer screening. This may be done if you have a family history of breast, ovarian, tubal, or peritoneal cancers.  Pelvic exam and Pap test. This  may be done every 3 years starting at age 15. Starting at age 80, this may be done every 5 years if you have a Pap test in combination with an HPV test. Discuss your test results, treatment options, and if necessary, the need for more tests with your health care provider. Vaccines Your health care provider may recommend certain vaccines, such as:  Influenza vaccine. This is recommended every year.  Tetanus, diphtheria, and acellular pertussis (Tdap, Td) vaccine. You may need a Td booster every 10 years.  Varicella vaccine. You may need this if you have not been vaccinated.  HPV vaccine. If you are 36 or younger, you may need three doses over 6 months.  Measles, mumps, and rubella (MMR) vaccine. You may need at least one dose of MMR. You may also need a second dose.  Pneumococcal 13-valent conjugate (PCV13) vaccine. You may need this if you have certain conditions and were not previously vaccinated.  Pneumococcal polysaccharide (PPSV23) vaccine. You may need one or two doses if you smoke cigarettes or if you have certain conditions.  Meningococcal vaccine. One dose is recommended if you are age 63-21 years and a first-year college student living in a residence hall, or if you have one of several medical conditions. You may also need additional booster doses.  Hepatitis A vaccine. You may need this if you have certain conditions or if you travel or work in places where you may be exposed to hepatitis A.  Hepatitis B vaccine. You may need this if you have certain conditions or if you travel or work  in places where you may be exposed to hepatitis B.  Haemophilus influenzae type b (Hib) vaccine. You may need this if you have certain risk factors. Talk to your health care provider about which screenings and vaccines you need and how often you need them. This information is not intended to replace advice given to you by your health care provider. Make sure you discuss any questions you have with  your health care provider. Document Released: 07/13/2001 Document Revised: 12/28/2016 Document Reviewed: 03/18/2015 Elsevier Interactive Patient Education  2019 [employer].

## 2018-11-15 NOTE — Progress Notes (Signed)
Name: Kelsey Barnes   MRN: [id number]    DOB: Oct 12, 1980   Date:11/15/2018       Progress Note  Subjective  Chief Complaint  Chief Complaint  Patient presents with  . Annual Exam    HPI   Patient presents for annual CPE   Diet: discussed a low carbohydrate diet to help her lose weight  Exercise: not very active now, working from home, discussed ways to be active  USPSTF grade A and B recommendations    Office Visit from 11/15/2018 in [facility]  AUDIT-C Score  1     Depression: Phq 9 is  negative Depression screen [facility] 2/9 11/15/2018 10/18/2018 04/14/2018 03/15/2018 10/14/2017  Decreased Interest 0 0 0 0 0  Down, Depressed, Hopeless 0 0 0 0 0  PHQ - 2 Score 0 0 0 0 0  Altered sleeping 0 0 0 0 1  Tired, decreased energy 0 0 0 0 1  Change in appetite 0 0 0 0 0  Feeling bad or failure about yourself  0 0 0 0 1  Trouble concentrating 0 0 0 0 1  Moving slowly or fidgety/restless 0 0 0 0 0  Suicidal thoughts 0 0 0 0 0  PHQ-9 Score 0 0 0 0 4  Difficult doing work/chores Not difficult at all Not difficult at all Not difficult at all Not difficult at all Somewhat difficult   Hypertension: BP Readings from Last 3 Encounters:  11/15/18 134/86  10/18/18 140/84  04/14/18 136/84   Obesity: Wt Readings from Last 3 Encounters:  11/15/18 258 lb 1.6 oz (117.1 kg)  10/18/18 262 lb 4.8 oz (119 kg)  04/14/18 245 lb (111.1 kg)   BMI Readings from Last 3 Encounters:  11/15/18 39.24 kg/m  10/18/18 43.65 kg/m  04/14/18 37.25 kg/m    Hep C Screening: we will check in the future, not sexually active  STD testing and prevention (HIV/chl/gon/syphilis): N/A Intimate partner violence: negative  Sexual History/Pain during Intercourse: not currently sexually active  Menstrual History/LMP/Abnormal Bleeding: regular with ocp's occasionally break through bleeding, discussed risk of DVT, she is not a smoker and trying to lose weight discussed symptoms of DVT   Incontinence Symptoms:   Advanced Care Planning: A voluntary discussion about advance care planning including the explanation and discussion of advance directives.  Discussed health care proxy and Living will, and the patient was able to identify a health care proxy as sister .  Patient does not have a living will at present time.  Breast cancer: N/A BRCA gene screening: N/A Cervical cancer screening: up to date   Osteoporosis Prevention: physically and high calcium and vitamin D diet   Lipids:  Lab Results  Component Value Date   CHOL 223 (H) 10/18/2018   CHOL 192 04/22/2017   CHOL 194 05/05/2016   Lab Results  Component Value Date   HDL 57 10/18/2018   HDL 55 04/22/2017   HDL 50 (L) 05/05/2016   Lab Results  Component Value Date   LDLCALC 142 (H) 10/18/2018   LDLCALC 114 04/22/2017   LDLCALC 123 (H) 05/05/2016   Lab Results  Component Value Date   TRIG 122 10/18/2018   TRIG 114 04/22/2017   TRIG 103 05/05/2016   Lab Results  Component Value Date   CHOLHDL 3.9 10/18/2018   CHOLHDL 3.9 05/05/2016   CHOLHDL 4.9 (H) 04/18/2015   No results found for: LDLDIRECT  Glucose:  Glucose  Date Value Ref Range Status  02/08/2014  87 65 - 99 mg/dL Final  01/29/2014 78 65 - 99 mg/dL Final  01/20/2014 77 65 - 99 mg/dL Final   Glucose, Bld  Date Value Ref Range Status  10/18/2018 68 65 - 99 mg/dL Final    Comment:    .            Fasting reference interval .   10/14/2017 75 65 - 99 mg/dL Final    Comment:    .            Fasting reference interval .   05/05/2016 81 65 - 99 mg/dL Final    Skin cancer: discussed atypical lesions   ECG:2015   Patient Active Problem List   Diagnosis Date Noted  . Need for hepatitis B vaccination 12/27/2017  . Vitamin D deficiency 12/13/2014  . Perennial allergic rhinitis 12/13/2014  . Metabolic syndrome 67/20/9470  . Herpes genitalis in women 12/13/2014  . Goiter 12/13/2014  . Dyslipidemia 12/13/2014  . Hematuria,  microscopic 12/13/2014  . Hypertension 09/06/2013  . Obesity (BMI 30-39.9) 09/06/2013  . H/O myomectomy 05/14/2013    Past Surgical History:  Procedure Laterality Date  . APPENDECTOMY  2008  . CHOLECYSTECTOMY  2016  . MYOMECTOMY  12.19.2014  . TONSILLECTOMY AND ADENOIDECTOMY N/A 10/15/2015   Procedure: TONSILLECTOMY ;  Surgeon: Carloyn Manner, MD;  Location: [place];  Service: ENT;  Laterality: N/A;  UPREG    Family History  Problem Relation Age of Onset  . Hypertension Mother   . Diabetes Mother   . COPD Mother   . Stroke Mother   . Hypertension Father   . Heart attack Father   . Hypertension Sister   . Heart disease Maternal Grandmother   . Diabetes Maternal Grandmother   . Hypertension Maternal Grandmother   . Cancer Maternal Grandfather     Social History   Socioeconomic History  . Marital status: Single    Spouse name: Not on file  . Number of children: 0  . Years of education: Not on file  . Highest education level: Associate degree: academic program  Occupational History  . 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 Financial resource strain: Not hard at all  . Food insecurity    Worry: Never true    Inability: Never true  . Transportation needs    Medical: No    Non-medical: No  Tobacco Use  . Smoking status: Never Smoker  . Smokeless tobacco: Never Used  Substance and Sexual Activity  . Alcohol use: Yes    Alcohol/week: 0.0 standard drinks    Comment: occasionally  . Drug use: No  . Sexual activity: Not Currently  Lifestyle  . Physical activity    Days per week: 0 days    Minutes per session: 0 min  . Stress: To some extent  Relationships  . Social connections    Talks on phone: More than three times a week    Gets together: More than three times a week    Attends religious service: Never    Active member of club or organization: No    Attends meetings of clubs or organizations: Never    Relationship  status: Never married  . Intimate partner violence    Fear of current or ex partner: No    Emotionally abused: No    Physically abused: No    Forced sexual activity: No  Other Topics Concern  . 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  amLODipine (NORVASC) 5 MG tablet, Take 1 tablet (5 mg total) by mouth daily., Disp: 90 tablet, Rfl: 3 .  azelastine (ASTELIN) 0.1 % nasal spray, Place 2 sprays into both nostrils 2 (two) times daily. Use in each nostril as directed, Disp: 90 mL, Rfl: 0 .  cetirizine (ZYRTEC) 10 MG tablet, Take 10 mg by mouth 2 (two) times daily. Am and pm, Disp: , Rfl:  .  fluticasone (FLONASE) 50 MCG/ACT nasal spray, Place 2 sprays into both nostrils daily., Disp: 48 g, Rfl: 0 .  norethindrone-ethinyl estradiol (NORTREL 0.5/35, 28,) 0.5-35 MG-MCG tablet, Take 1 tablet by mouth daily., Disp: 84 tablet, Rfl: 0 .  triamterene-hydrochlorothiazide (DYAZIDE) 37.5-25 MG capsule, TAKE 1 CAPSULE DAILY, Disp: 90 capsule, Rfl: 0 .  valACYclovir (VALTREX) 500 MG tablet, Daily  May take up to tid during an outbreak, Disp: 100 tablet, Rfl: 3  Allergies  Allergen Reactions  . Ace Inhibitors     Per ENT , cough   . Percocet [Oxycodone-Acetaminophen] Itching and Other (See Comments)    Hot Flashes     ROS  Constitutional: Negative for fever or weight change.  Respiratory: Negative for cough and shortness of breath.   Cardiovascular: Negative for chest pain or palpitations.  Gastrointestinal: Negative for abdominal pain, no bowel changes.  Musculoskeletal: Negative for gait problem or joint swelling.  Skin: Negative for rash.  Neurological: Negative for dizziness or headache.  No other specific complaints in a complete review of systems (except as listed in HPI above).  Objective  Vitals:   11/15/18 1428  BP: 134/86  Pulse: (!)  103  Resp: 16  Temp: 98.3 F (36.8 C)  TempSrc: Oral  SpO2: 98%  Weight: 258 lb 1.6 oz (117.1 kg)  Height: 5' 8" (1.727 m)    Body mass index is 39.24 kg/m.  Physical Exam  Constitutional: Patient appears well-developed and well-nourished. No distress.  HENT: Head: Normocephalic and atraumatic. Ears: B TMs ok, no erythema or effusion; Nose: Nose normal. Mouth/Throat: Oropharynx is clear and moist. No oropharyngeal exudate.  Eyes: Conjunctivae and EOM are normal. Pupils are equal, round, and reactive to light. No scleral icterus.  Neck: Normal range of motion. Neck supple. No JVD present. No thyromegaly present.  Cardiovascular: Normal rate, regular rhythm and normal heart sounds.  No murmur heard. No BLE edema. Pulmonary/Chest: Effort normal and breath sounds normal. No respiratory distress. Abdominal: Soft. Bowel sounds are normal, no distension. There is no tenderness. no masses Breast: no lumps or masses, no nipple discharge or rashes FEMALE GENITALIA:  Not done RECTAL: not done  Musculoskeletal: Normal range of motion, no joint effusions. No gross deformities Neurological: he is alert and oriented to person, place, and time. No cranial nerve deficit. Coordination, balance, strength, speech and gait are normal.  Skin: Skin is warm and dry. No rash noted. No erythema.  Psychiatric: Patient has a normal mood and affect. behavior is normal. Judgment and thought content normal.  Recent Results (from the past 2160 hour(s))  COMPLETE METABOLIC PANEL WITH GFR     Status: None   Collection Time: 10/18/18  2:54 PM  Result Value Ref Range   Glucose, Bld 68 65 - 99 mg/dL    Comment: .  Fasting reference interval .    BUN 11 7 - 25 mg/dL   Creat 0.77 0.50 - 1.10 mg/dL   GFR, Est Non African American 99 > OR = 60 mL/min/1.90m   GFR, Est African American 115 > OR = 60 mL/min/1.727m  BUN/Creatinine Ratio NOT APPLICABLE 6 - 22 (calc)   Sodium 137 135 - 146 mmol/L    Potassium 3.6 3.5 - 5.3 mmol/L   Chloride 102 98 - 110 mmol/L   CO2 26 20 - 32 mmol/L   Calcium 9.4 8.6 - 10.2 mg/dL   Total Protein 7.5 6.1 - 8.1 g/dL   Albumin 4.1 3.6 - 5.1 g/dL   Globulin 3.4 1.9 - 3.7 g/dL (calc)   AG Ratio 1.2 1.0 - 2.5 (calc)   Total Bilirubin 0.4 0.2 - 1.2 mg/dL   Alkaline phosphatase (APISO) 47 31 - 125 U/L   AST 19 10 - 30 U/L   ALT 18 6 - 29 U/L  CBC with Differential/Platelet     Status: Abnormal   Collection Time: 10/18/18  2:54 PM  Result Value Ref Range   WBC 8.0 3.8 - 10.8 Thousand/uL   RBC 4.50 3.80 - 5.10 Million/uL   Hemoglobin 12.5 11.7 - 15.5 g/dL   HCT 38.5 35.0 - 45.0 %   MCV 85.6 80.0 - 100.0 fL   MCH 27.8 27.0 - 33.0 pg   MCHC 32.5 32.0 - 36.0 g/dL   RDW 12.2 11.0 - 15.0 %   Platelets 402 (H) 140 - 400 Thousand/uL   MPV 10.6 7.5 - 12.5 fL   Neutro Abs 4,776 1,500 - 7,800 cells/uL   Lymphs Abs 2,632 850 - 3,900 cells/uL   Absolute Monocytes 472 200 - 950 cells/uL   Eosinophils Absolute 72 15 - 500 cells/uL   Basophils Absolute 48 0 - 200 cells/uL   Neutrophils Relative % 59.7 %   Total Lymphocyte 32.9 %   Monocytes Relative 5.9 %   Eosinophils Relative 0.9 %   Basophils Relative 0.6 %  Lipid panel     Status: Abnormal   Collection Time: 10/18/18  2:54 PM  Result Value Ref Range   Cholesterol 223 (H) <200 mg/dL   HDL 57 > OR = 50 mg/dL   Triglycerides 122 <150 mg/dL   LDL Cholesterol (Calc) 142 (H) mg/dL (calc)    Comment: Reference range: <100 . Desirable range <100 mg/dL for primary prevention;   <70 mg/dL for patients with CHD or diabetic patients  with > or = 2 CHD risk factors. . Marland KitchenDL-C is now calculated using the Martin-Hopkins  calculation, which is a validated novel method providing  better accuracy than the Friedewald equation in the  estimation of LDL-C.  MaCresenciano Genret al. JAAnnamaria [id number];[phone]  (http://education.[facility].com/faq/FAQ164)    Total CHOL/HDL Ratio 3.9 <5.0 (calc)   Non-HDL Cholesterol  (Calc) 166 (H) <130 mg/dL (calc)    Comment: For patients with diabetes plus 1 major ASCVD risk  factor, treating to a non-HDL-C goal of <100 mg/dL  (LDL-C of <70 mg/dL) is considered a therapeutic  option.   Hemoglobin A1c     Status: None   Collection Time: 10/18/18  2:54 PM  Result Value Ref Range   Hgb A1c MFr Bld 4.9 <5.7 % of total Hgb    Comment: For the purpose of screening for the presence of diabetes: . <5.7%       Consistent with the absence of diabetes 5.7-6.4%    Consistent with increased risk  for diabetes             (prediabetes) > or =6.5%  Consistent with diabetes . This assay result is consistent with a decreased risk of diabetes. . Currently, no consensus exists regarding use of hemoglobin A1c for diagnosis of diabetes in children. . According to American Diabetes Association (ADA) guidelines, hemoglobin A1c <7.0% represents optimal control in non-pregnant diabetic patients. Different metrics may apply to specific patient populations.  Standards of Medical Care in Diabetes(ADA). .    Mean Plasma Glucose 94 (calc)   eAG (mmol/L) 5.2 (calc)  VITAMIN D 25 Hydroxy (Vit-D Deficiency, Fractures)     Status: None   Collection Time: 10/18/18  2:54 PM  Result Value Ref Range   Vit D, 25-Hydroxy 40 30 - 100 ng/mL    Comment: Vitamin D Status         25-OH Vitamin D: . Deficiency:                    <20 ng/mL Insufficiency:             20 - 29 ng/mL Optimal:                 > or = 30 ng/mL . For 25-OH Vitamin D testing on patients on  D2-supplementation and patients for whom quantitation  of D2 and D3 fractions is required, the [facility](TM) 25-OH VIT D, (D2,D3), LC/MS/MS is recommended: order  code [phone] (patients >61yr). See Note 1 . Note 1 . For additional information, please refer to  http://education.[facility].com/faq/FAQ199  (This link is being provided for informational/ educational purposes only.)       PHQ2/9: Depression screen  [facility] 11/15/2018 10/18/2018 04/14/2018 03/15/2018 10/14/2017  Decreased Interest 0 0 0 0 0  Down, Depressed, Hopeless 0 0 0 0 0  PHQ - 2 Score 0 0 0 0 0  Altered sleeping 0 0 0 0 1  Tired, decreased energy 0 0 0 0 1  Change in appetite 0 0 0 0 0  Feeling bad or failure about yourself  0 0 0 0 1  Trouble concentrating 0 0 0 0 1  Moving slowly or fidgety/restless 0 0 0 0 0  Suicidal thoughts 0 0 0 0 0  PHQ-9 Score 0 0 0 0 4  Difficult doing work/chores Not difficult at all Not difficult at all Not difficult at all Not difficult at all Somewhat difficult     Fall Risk: Fall Risk  11/15/2018 10/18/2018 04/14/2018 03/15/2018 12/23/2017  Falls in the past year? 0 0 0 No No  Number falls in past yr: 0 0 0 - -  Injury with Fall? 0 0 0 - -     Functional Status Survey: Is the patient deaf or have difficulty hearing?: No Does the patient have difficulty seeing, even when wearing glasses/contacts?: Yes Does the patient have difficulty concentrating, remembering, or making decisions?: No Does the patient have difficulty walking or climbing stairs?: No Does the patient have difficulty dressing or bathing?: No Does the patient have difficulty doing errands alone such as visiting a doctor's office or shopping?: No   Assessment & Plan  1. Well woman exam  Discussed low carbohydrate diet   -USPSTF grade A and B recommendations reviewed with patient; age-appropriate recommendations, preventive care, screening tests, etc discussed and encouraged; healthy living encouraged; see AVS for patient education given to patient -Discussed importance of 150 minutes of physical activity weekly, eat two servings of fish weekly,  eat one serving of tree nuts ( cashews, pistachios, pecans, almonds.Marland Kitchen) every other day, eat 6 servings of fruit/vegetables daily and drink plenty of water and avoid sweet beverages.

## 2018-12-08 ENCOUNTER — Other Ambulatory Visit: Payer: Self-pay | Admitting: Nurse Practitioner

## 2018-12-08 DIAGNOSIS — I10 Essential (primary) hypertension: Secondary | ICD-10-CM

## 2018-12-12 ENCOUNTER — Other Ambulatory Visit: Payer: Self-pay | Admitting: Family Medicine

## 2018-12-12 ENCOUNTER — Telehealth: Payer: Self-pay

## 2018-12-12 DIAGNOSIS — Z111 Encounter for screening for respiratory tuberculosis: Secondary | ICD-10-CM

## 2018-12-12 NOTE — Telephone Encounter (Signed)
Copied from [place] [phone]. Topic: Appointment Scheduling - Scheduling Inquiry for Clinic >> Dec 12, 2018  9:26 AM Celene Kras A wrote: Reason for CRM: Pt called stating she is needing TB test. Pt states she is needing the quantiferon gold test. Pt states they just told her that her TB test had expired and that she is needing the test for her class. Pt is requesting this as quickly as possible.

## 2018-12-18 DIAGNOSIS — Z111 Encounter for screening for respiratory tuberculosis: Secondary | ICD-10-CM | POA: Diagnosis not present

## 2018-12-20 LAB — QUANTIFERON-TB GOLD PLUS
Mitogen-NIL: 7.29 IU/mL
NIL: 0.04 IU/mL
QuantiFERON-TB Gold Plus: NEGATIVE
TB1-NIL: 0 IU/mL
TB2-NIL: 0 IU/mL

## 2019-01-28 ENCOUNTER — Other Ambulatory Visit: Payer: Self-pay | Admitting: Family Medicine

## 2019-01-28 DIAGNOSIS — I10 Essential (primary) hypertension: Secondary | ICD-10-CM

## 2019-03-13 ENCOUNTER — Other Ambulatory Visit: Payer: Self-pay

## 2019-03-13 ENCOUNTER — Ambulatory Visit (INDEPENDENT_AMBULATORY_CARE_PROVIDER_SITE_OTHER): Payer: BC Managed Care – PPO

## 2019-03-13 DIAGNOSIS — Z23 Encounter for immunization: Secondary | ICD-10-CM | POA: Diagnosis not present

## 2019-03-18 ENCOUNTER — Other Ambulatory Visit: Payer: Self-pay | Admitting: Family Medicine

## 2019-03-18 DIAGNOSIS — Z3041 Encounter for surveillance of contraceptive pills: Secondary | ICD-10-CM

## 2019-03-19 ENCOUNTER — Other Ambulatory Visit: Payer: Self-pay | Admitting: Family Medicine

## 2019-03-19 DIAGNOSIS — Z3041 Encounter for surveillance of contraceptive pills: Secondary | ICD-10-CM

## 2019-03-19 MED ORDER — NORTREL 0.5/35 (28) 0.5-35 MG-MCG PO TABS: 1.0000 | ORAL_TABLET | Freq: Every day | ORAL | 0 refills | Status: DC

## 2019-03-19 NOTE — Telephone Encounter (Signed)
Copied from [place] [id number]. Topic: Quick Communication - Rx Refill/Question >> Mar 19, 2019 11:37 AM Rainey Pines A wrote: Medication:NORTREL 0.5/35, 28, 0.5-35 MG-MCG tablet (Patient requesting a 30 day supply of medication sent to local pharmacy.)  Has the patient contacted their pharmacy? {Yes (Agent: If no, request that the patient contact the pharmacy for the refill.) (Agent: If yes, when and what did the pharmacy advise?)Contact  PCP  Preferred Pharmacy (with phone number or street name):  [facility] [place] [address] [phone] (Phone) [phone] (Fax)   Agent: Please be advised that RX refills may take up to 3 business days. We ask that you follow-up with your pharmacy.

## 2019-04-18 ENCOUNTER — Encounter: Payer: Self-pay | Admitting: Family Medicine

## 2019-04-18 ENCOUNTER — Ambulatory Visit (INDEPENDENT_AMBULATORY_CARE_PROVIDER_SITE_OTHER): Payer: BC Managed Care – PPO | Admitting: Family Medicine

## 2019-04-18 ENCOUNTER — Other Ambulatory Visit: Payer: Self-pay | Admitting: Family Medicine

## 2019-04-18 ENCOUNTER — Other Ambulatory Visit: Payer: Self-pay

## 2019-04-18 DIAGNOSIS — R1013 Epigastric pain: Secondary | ICD-10-CM

## 2019-04-18 DIAGNOSIS — Z3041 Encounter for surveillance of contraceptive pills: Secondary | ICD-10-CM | POA: Diagnosis not present

## 2019-04-18 DIAGNOSIS — A6009 Herpesviral infection of other urogenital tract: Secondary | ICD-10-CM | POA: Diagnosis not present

## 2019-04-18 DIAGNOSIS — D75839 Thrombocytosis, unspecified: Secondary | ICD-10-CM

## 2019-04-18 DIAGNOSIS — I10 Essential (primary) hypertension: Secondary | ICD-10-CM | POA: Diagnosis not present

## 2019-04-18 DIAGNOSIS — E559 Vitamin D deficiency, unspecified: Secondary | ICD-10-CM

## 2019-04-18 DIAGNOSIS — E8881 Metabolic syndrome: Secondary | ICD-10-CM

## 2019-04-18 DIAGNOSIS — E785 Hyperlipidemia, unspecified: Secondary | ICD-10-CM

## 2019-04-18 DIAGNOSIS — D473 Essential (hemorrhagic) thrombocythemia: Secondary | ICD-10-CM

## 2019-04-18 DIAGNOSIS — R109 Unspecified abdominal pain: Secondary | ICD-10-CM

## 2019-04-18 MED ORDER — VALACYCLOVIR HCL 500 MG PO TABS: ORAL_TABLET | ORAL | 3 refills | Status: DC

## 2019-04-18 MED ORDER — TRIAMTERENE-HCTZ 37.5-25 MG PO CAPS: 1.0000 | ORAL_CAPSULE | Freq: Every day | ORAL | 1 refills | Status: DC

## 2019-04-18 MED ORDER — FAMOTIDINE 40 MG PO TABS: 40.0000 mg | ORAL_TABLET | Freq: Every day | ORAL | 0 refills | Status: DC

## 2019-04-18 MED ORDER — AMLODIPINE BESYLATE-VALSARTAN 5-160 MG PO TABS: 1.0000 | ORAL_TABLET | Freq: Every day | ORAL | 0 refills | Status: DC

## 2019-04-18 MED ORDER — NORTREL 0.5/35 (28) 0.5-35 MG-MCG PO TABS: 1.0000 | ORAL_TABLET | Freq: Every day | ORAL | 1 refills | Status: DC

## 2019-04-18 NOTE — Addendum Note (Signed)
Addended by: Steele Sizer F on: 04/18/2019 03:45 PM   Modules accepted: Orders

## 2019-04-18 NOTE — Patient Instructions (Signed)
You can try Miralax otc Irritable Bowel Syndrome, Adult  Irritable bowel syndrome (IBS) is a group of symptoms that affects the organs responsible for digestion (gastrointestinal or GI tract). IBS is not one specific disease. To regulate how the GI tract works, the body sends signals back and forth between the intestines and the brain. If you have IBS, there may be a problem with these signals. As a result, the GI tract does not function normally. The intestines may become more sensitive and overreact to certain things. This may be especially true when you eat certain foods or when you are under stress. There are four types of IBS. These may be determined based on the consistency of your stool (feces):  IBS with diarrhea.  IBS with constipation.  Mixed IBS.  Unsubtyped IBS. It is important to know which type of IBS you have. Certain treatments are more likely to be helpful for certain types of IBS. What are the causes? The exact cause of IBS is not known. What increases the risk? You may have a higher risk for IBS if you:  Are female.  Are younger than 17.  Have a family history of IBS.  Have a mental health condition, such as depression, anxiety, or post-traumatic stress disorder.  Have had a bacterial infection of your GI tract. What are the signs or symptoms? Symptoms of IBS vary from person to person. The main symptom is abdominal pain or discomfort. Other symptoms usually include one or more of the following:  Diarrhea, constipation, or both.  Abdominal swelling or bloating.  Feeling full after eating a small or regular-sized meal.  Frequent gas.  Mucus in the stool.  A feeling of having more stool left after a bowel movement. Symptoms tend to come and go. They may be triggered by stress, mental health conditions, or certain foods. How is this diagnosed? This condition may be diagnosed based on a physical exam, your medical history, and your symptoms. You may have  tests, such as:  Blood tests.  Stool test.  X-rays.  CT scan.  Colonoscopy. This is a procedure in which your GI tract is viewed with a long, thin, flexible tube. How is this treated? There is no cure for IBS, but treatment can help relieve symptoms. Treatment depends on the type of IBS you have, and may include:  Changes to your diet, such as: ? Avoiding foods that cause symptoms. ? Drinking more water. ? Following a low-FODMAP (fermentable oligosaccharides, disaccharides, monosaccharides, and polyols) diet for up to 6 weeks, or as told by your health care provider. FODMAPs are sugars that are hard for some people to digest. ? Eating more fiber. ? Eating medium-sized meals at the same times every day.  Medicines. These may include: ? Fiber supplements, if you have constipation. ? Medicine to control diarrhea (antidiarrheal medicines). ? Medicine to help control muscle tightening (spasms) in your GI tract (antispasmodic medicines). ? Medicines to help with mental health conditions, such as antidepressants or tranquilizers.  Talk therapy or counseling.  Working with a diet and nutrition specialist (dietitian) to help create a food plan that is right for you.  Managing your stress. Follow these instructions at home: Eating and drinking  Eat a healthy diet.  Eat medium-sized meals at about the same time every day. Do not eat large meals.  Gradually eat more fiber-rich foods. These include whole grains, fruits, and vegetables. This may be especially helpful if you have IBS with constipation.  Eat a diet low  in FODMAPs.  Drink enough fluid to keep your urine pale yellow.  Keep a journal of foods that seem to trigger symptoms.  Avoid foods and drinks that: ? Contain added sugar. ? Make your symptoms worse. Dairy products, caffeinated drinks, and carbonated drinks can make symptoms worse for some people. General instructions  Take over-the-counter and prescription  medicines and supplements only as told by your health care provider.  Get enough exercise. Do at least 150 minutes of moderate-intensity exercise each week.  Manage your stress. Getting enough sleep and exercise can help you manage stress.  Keep all follow-up visits as told by your health care provider and therapist. This is important. Alcohol Use  Do not drink alcohol if: ? Your health care provider tells you not to drink. ? You are pregnant, may be pregnant, or are planning to become pregnant.  If you drink alcohol, limit how much you have: ? 0-1 drink a day for women. ? 0-2 drinks a day for men.  Be aware of how much alcohol is in your drink. In the [place] one drink equals one typical bottle of beer (12 oz), one-half glass of wine (5 oz), or one shot of hard liquor (1 oz). Contact a health care provider if you have:  Constant pain.  Weight loss.  Difficulty or pain when swallowing.  Diarrhea that gets worse. Get help right away if you have:  Severe abdominal pain.  Fever.  Diarrhea with symptoms of dehydration, such as dizziness or dry mouth.  Bright red blood in your stool.  Stool that is black and tarry.  Abdominal swelling.  Vomiting that does not stop.  Blood in your vomit. Summary  Irritable bowel syndrome (IBS) is not one specific disease. It is a group of symptoms that affects digestion.  Your intestines may become more sensitive and overreact to certain things. This may be especially true when you eat certain foods or when you are under stress.  There is no cure for IBS, but treatment can help relieve symptoms. This information is not intended to replace advice given to you by your health care provider. Make sure you discuss any questions you have with your health care provider. Document Released: 05/17/2005 Document Revised: 05/10/2017 Document Reviewed: 05/10/2017 Elsevier Patient Education  2020 [employer].

## 2019-04-18 NOTE — Progress Notes (Signed)
Name: Kelsey Barnes   MRN: [id number]    DOB: December 17, 1980   Date:04/18/2019       Progress Note  Subjective  Chief Complaint  Chief Complaint  Patient presents with  . Hypertension  . Allergic Rhinitis   . Dyslipidemia  . Medication Refill    6 month F/U    HPI  HTN: she is off ACEshe was advised by ENT to stop and is now on Triamterene/hctz and also on Norvasc 5 mg, bp today is 140, explained because of her age recommend it to be a little lower, we will try Diovan 160 to Norvasc 5 mg , no chest pain, palpitation of SOB. Discussed teratogenic effects  AR: she has two nasal sprays and takes Zyrtec at [facility] she is not using nasal sprays lately, states she has recurrent left sinus pressure, had a facial injury many years ago. She has been sneezing more and will resume nasal spray   Herpes : no outbreaks as long as she takes medication. She needs refills.   Metabolic syndrome/obesity: last labs were normal,she states her diet has not been good lately, she lives by herself now ( mother died 06/27/2018), she has been eating fast food but trying to eat more salads and lean meat.   Dyslipidemia: LDL was high, HDL is at goal. Discussed importance of healthy diet   Contraception: explained needs to use condoms, risk of DVT and also we need to get bp better controlled. She needs refills. She has not sexually active lately   Intermittent abdominal pain: she states she has intermittent abdominal pain and bloating after meals. She feels bloated , associated with abdominal cramping, urge to use the bathroom and sometimes associated with diarrhea. No fever or chills. She is status after cholecystomy in 2016. She states the burning sensation feels like her gallbladder episodes. Discussed IBS. She states symptoms started after appendectomy about 12 years ago and worse after cholecystectomy. She states "attacks" are becoming more intense. Discussed referral to GI or try medication,  it may also be secondary to not having gallbladder.    Patient Active Problem List   Diagnosis Date Noted  . Need for hepatitis B vaccination 12/27/2017  . Vitamin D deficiency 12/13/2014  . Perennial allergic rhinitis 12/13/2014  . Metabolic syndrome [id number]  . Herpes genitalis in women 12/13/2014  . Goiter 12/13/2014  . Dyslipidemia 12/13/2014  . Hematuria, microscopic 12/13/2014  . Hypertension 09/06/2013  . Obesity (BMI 30-39.9) 09/06/2013  . H/O myomectomy 05/14/2013    Past Surgical History:  Procedure Laterality Date  . APPENDECTOMY  2008  . CHOLECYSTECTOMY  2016  . MYOMECTOMY  12.19.2014  . TONSILLECTOMY AND ADENOIDECTOMY N/A 10/15/2015   Procedure: TONSILLECTOMY ;  Surgeon: Carloyn Manner, MD;  Location: [place];  Service: ENT;  Laterality: N/A;  UPREG    Family History  Problem Relation Age of Onset  . Hypertension Mother   . Diabetes Mother   . COPD Mother   . Stroke Mother   . Hypertension Father   . Heart attack Father   . Hypertension Sister   . Heart disease Maternal Grandmother   . Diabetes Maternal Grandmother   . Hypertension Maternal Grandmother   . Cancer Maternal Grandfather     Social History   Socioeconomic History  . Marital status: Single    Spouse name: Not on file  . Number of children: 0  . Years of education: Not on file  . Highest education level: Associate degree: academic  program  Occupational History  . 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 Financial resource strain: Not hard at all  . Food insecurity    Worry: Never true    Inability: Never true  . Transportation needs    Medical: No    Non-medical: No  Tobacco Use  . Smoking status: Never Smoker  . Smokeless tobacco: Never Used  Substance and Sexual Activity  . Alcohol use: Yes    Alcohol/week: 0.0 standard drinks    Comment: occasionally  . Drug use: No  . Sexual activity: Not Currently  Lifestyle  . Physical activity     Days per week: 0 days    Minutes per session: 0 min  . Stress: To some extent  Relationships  . Social connections    Talks on phone: More than three times a week    Gets together: More than three times a week    Attends religious service: Never    Active member of club or organization: No    Attends meetings of clubs or organizations: Never    Relationship status: Never married  . Intimate partner violence    Fear of current or ex partner: No    Emotionally abused: No    Physically abused: No    Forced sexual activity: No  Other Topics Concern  . 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  azelastine (ASTELIN) 0.1 % nasal spray, Place 2 sprays into both nostrils 2 (two) times daily. Use in each nostril as directed, Disp: 90 mL, Rfl: 0 .  cetirizine (ZYRTEC) 10 MG tablet, Take 10 mg by mouth 2 (two) times daily. Am and pm, Disp: , Rfl:  .  fluticasone (FLONASE) 50 MCG/ACT nasal spray, Place 2 sprays into both nostrils daily., Disp: 48 g, Rfl: 0 .  norethindrone-ethinyl estradiol (NORTREL 0.5/35, 28,) 0.5-35 MG-MCG tablet, Take 1 tablet by mouth daily., Disp: 84 tablet, Rfl: 1 .  triamterene-hydrochlorothiazide (DYAZIDE) 37.5-25 MG capsule, Take 1 each (1 capsule total) by mouth daily., Disp: 90 capsule, Rfl: 1 .  valACYclovir (VALTREX) 500 MG tablet, Daily  May take up to tid during an outbreak, Disp: 100 tablet, Rfl: 3 .  amLODipine-valsartan (EXFORGE) 5-160 MG tablet, Take 1 tablet by mouth daily., Disp: 90 tablet, Rfl: 0  Allergies  Allergen Reactions  . Ace Inhibitors     Per ENT , cough   . Percocet [Oxycodone-Acetaminophen] Itching and Other (See Comments)    Hot Flashes    I personally reviewed active problem list, medication list, allergies, family history, social history, health maintenance with the patient/caregiver  today.   ROS  Constitutional: Negative for fever or weight change.  Respiratory: Negative for cough and shortness of breath.   Cardiovascular: Negative for chest pain or palpitations.  Gastrointestinal: Negative for abdominal pain, no bowel changes. She has alternating constipation and diarrhea  Musculoskeletal: Negative for gait problem or joint swelling.  Skin: Negative for rash.  Neurological: Negative for dizziness or headache.  No other specific complaints in a complete review of systems (except as listed in HPI above).   Objective  Vitals:   04/18/19 1501  BP: 140/66  Pulse: (!) 105  Resp: 16  Temp: (!) 96.9 F (36.1 C)  TempSrc: Temporal  SpO2:  99%  Weight: 248 lb 12.8 oz (112.9 kg)  Height: 5\' 8"  (1.727 m)    Body mass index is 37.83 kg/m.  Physical Exam  Constitutional: Patient appears well-developed and well-nourished. Obese  No distress.  HEENT: head atraumatic, normocephalic, pupils equal and reactive to light Cardiovascular: Normal rate, regular rhythm and normal heart sounds.  No murmur heard. No BLE edema. Pulmonary/Chest: Effort normal and breath sounds normal. No respiratory distress. Abdominal: Soft.  There is no tenderness. Psychiatric: Patient has a normal mood and affect. behavior is normal. Judgment and thought content normal.  PHQ2/9: Depression screen [facility] 2/9 04/18/2019 11/15/2018 10/18/2018 04/14/2018 03/15/2018  Decreased Interest 0 0 0 0 0  Down, Depressed, Hopeless 0 0 0 0 0  PHQ - 2 Score 0 0 0 0 0  Altered sleeping 0 0 0 0 0  Tired, decreased energy 0 0 0 0 0  Change in appetite 0 0 0 0 0  Feeling bad or failure about yourself  0 0 0 0 0  Trouble concentrating 0 0 0 0 0  Moving slowly or fidgety/restless 0 0 0 0 0  Suicidal thoughts 0 0 0 0 0  PHQ-9 Score 0 0 0 0 0  Difficult doing work/chores - Not difficult at all Not difficult at all Not difficult at all Not difficult at all    phq 9 is negative   Fall Risk: Fall Risk   04/18/2019 11/15/2018 10/18/2018 04/14/2018 03/15/2018  Falls in the past year? 0 0 0 0 No  Number falls in past yr: 0 0 0 0 -  Injury with Fall? 0 0 0 0 -     Functional Status Survey: Is the patient deaf or have difficulty hearing?: No Does the patient have difficulty seeing, even when wearing glasses/contacts?: No Does the patient have difficulty concentrating, remembering, or making decisions?: Yes Does the patient have difficulty walking or climbing stairs?: No Does the patient have difficulty dressing or bathing?: No Does the patient have difficulty doing errands alone such as visiting a doctor's office or shopping?: No    Assessment & Plan   1. Herpes genitalis in women  - valACYclovir (VALTREX) 500 MG tablet; Daily  May take up to tid during an outbreak  Dispense: 100 tablet; Refill: 3  2. Essential hypertension  - triamterene-hydrochlorothiazide (DYAZIDE) 37.5-25 MG capsule; Take 1 each (1 capsule total) by mouth daily.  Dispense: 90 capsule; Refill: 1 - amLODipine-valsartan (EXFORGE) 5-160 MG tablet; Take 1 tablet by mouth daily.  Dispense: 90 tablet; Refill: 0  3. Encounter for surveillance of contraceptive pills  - norethindrone-ethinyl estradiol (NORTREL 0.5/35, 28,) 0.5-35 MG-MCG tablet; Take 1 tablet by mouth daily.  Dispense: 84 tablet; Refill: 1  4. Morbid obesity ([place])  Discussed with the patient the risk posed by an increased BMI. Discussed importance of portion control, calorie counting and at least 150 minutes of physical activity weekly. Avoid sweet beverages and drink more water. Eat at least 6 servings of fruit and vegetables daily   5. Vitamin D deficiency  Continue supplementation   6. Metabolic syndrome   7. Dyslipidemia  Discussed life style modifications again and we will consider statin at age 25   8. Thrombocytosis (HCC)  - Lipase  9. Intermittent abdominal pain  - Lipase  10. Dyspepsia  - H. pylori breath test

## 2019-06-20 ENCOUNTER — Other Ambulatory Visit: Payer: Self-pay

## 2019-06-20 DIAGNOSIS — I10 Essential (primary) hypertension: Secondary | ICD-10-CM

## 2019-06-20 NOTE — Telephone Encounter (Signed)
Hypertension medication request: Amlodipine-Valsartan to [facility].  Last office visit pertaining to hypertension: 04/18/2019  BP Readings from Last 3 Encounters:  04/18/19 140/66  11/15/18 134/86  10/18/18 140/84    Lab Results  Component Value Date   CREATININE 0.77 10/18/2018   BUN 11 10/18/2018   NA 137 10/18/2018   K 3.6 10/18/2018   CL 102 10/18/2018   CO2 26 10/18/2018     Follow up on 07/20/2019

## 2019-06-25 ENCOUNTER — Encounter: Payer: Self-pay | Admitting: Family Medicine

## 2019-06-25 NOTE — Telephone Encounter (Signed)
Pt called and I advised her of needing to come in for BP check/ Pt scheduled to come in for BP check next Thursday at 2pm/

## 2019-07-05 ENCOUNTER — Ambulatory Visit: Payer: BC Managed Care – PPO

## 2019-07-20 ENCOUNTER — Ambulatory Visit: Payer: BC Managed Care – PPO | Admitting: Family Medicine

## 2019-07-30 ENCOUNTER — Encounter: Payer: Self-pay | Admitting: Family Medicine

## 2019-07-30 ENCOUNTER — Other Ambulatory Visit: Payer: Self-pay

## 2019-07-30 ENCOUNTER — Ambulatory Visit (INDEPENDENT_AMBULATORY_CARE_PROVIDER_SITE_OTHER): Payer: BC Managed Care – PPO | Admitting: Family Medicine

## 2019-07-30 DIAGNOSIS — E785 Hyperlipidemia, unspecified: Secondary | ICD-10-CM

## 2019-07-30 DIAGNOSIS — Z86018 Personal history of other benign neoplasm: Secondary | ICD-10-CM

## 2019-07-30 DIAGNOSIS — E8881 Metabolic syndrome: Secondary | ICD-10-CM

## 2019-07-30 DIAGNOSIS — E559 Vitamin D deficiency, unspecified: Secondary | ICD-10-CM

## 2019-07-30 DIAGNOSIS — J3089 Other allergic rhinitis: Secondary | ICD-10-CM

## 2019-07-30 DIAGNOSIS — D75839 Thrombocytosis, unspecified: Secondary | ICD-10-CM | POA: Insufficient documentation

## 2019-07-30 DIAGNOSIS — I10 Essential (primary) hypertension: Secondary | ICD-10-CM

## 2019-07-30 DIAGNOSIS — Z8616 Personal history of COVID-19: Secondary | ICD-10-CM

## 2019-07-30 DIAGNOSIS — D473 Essential (hemorrhagic) thrombocythemia: Secondary | ICD-10-CM

## 2019-07-30 DIAGNOSIS — F341 Dysthymic disorder: Secondary | ICD-10-CM

## 2019-07-30 MED ORDER — AMLODIPINE BESYLATE-VALSARTAN 5-160 MG PO TABS: 1.0000 | ORAL_TABLET | Freq: Every day | ORAL | 1 refills | Status: DC

## 2019-07-30 NOTE — Progress Notes (Signed)
Name: Kelsey Barnes   MRN: [id number]    DOB: Oct 02, 1980   Date:07/30/2019       Progress Note  Subjective  Chief Complaint  Chief Complaint  Patient presents with  . Hypertension    6 month recheck    HPI  HTN: she is off ACEshe was advised by ENT to stop and is now on Triamterene/hctz and we added Exforge 5/160 mg daily 3 months ago and bp today is at goal,  no chest pain, palpitation of SOB. Discussed teratogenic effects  AR: she has two nasal sprays and takes Zyrtec at [facility] she is not using nasal sprays lately, states she has recurrent left sinus pressure, had a facial injury many years ago. She has been sneezing more and will resume nasal spray   Herpes : no outbreaks as long as she takes medication. Unchanged   Metabolic syndrome/obesity: last labs were normal,she states her diet has not been good lately, she lives by herself now ( mother died 07-17-18), she has been eating fast food but trying to eat more salads and lean meat. No physically active at this time  Dyslipidemia:LDL was high, HDL is at goal. She is not eating healthy, she is working 30 hours a week and going to school. Diet is not great at this time, but trying to make healthier choices.   Contraception: explained needs to use condoms, risk of DVT and also we need to get bp better controlled.  She has not sexually active lately . She will go see GYN for her pap smear, she has history myomectomy and is starting to have more abdominal cramping and heavier cycles   Abdominal pain: she did not get labs, symptoms resolved, she developed COVID-19 about 10 days later and symptoms resolved now   Dysthymia: discussed physical activity, mindfulness and return sooner if no improvement of symptoms. She is working from home, mother died on year ago.   Patient Active Problem List   Diagnosis Date Noted  . Need for hepatitis B vaccination 12/27/2017  . Vitamin D deficiency 12/13/2014  . Perennial  allergic rhinitis 12/13/2014  . Metabolic syndrome [id number]  . Herpes genitalis in women 12/13/2014  . Goiter 12/13/2014  . Dyslipidemia 12/13/2014  . Hematuria, microscopic 12/13/2014  . Hypertension 09/06/2013  . Obesity (BMI 30-39.9) 09/06/2013  . H/O myomectomy 05/14/2013    Past Surgical History:  Procedure Laterality Date  . APPENDECTOMY  2008  . CHOLECYSTECTOMY  2016  . MYOMECTOMY  12.19.2014  . TONSILLECTOMY AND ADENOIDECTOMY N/A 10/15/2015   Procedure: TONSILLECTOMY ;  Surgeon: Carloyn Manner, MD;  Location: [place];  Service: ENT;  Laterality: N/A;  UPREG    Family History  Problem Relation Age of Onset  . Hypertension Mother   . Diabetes Mother   . COPD Mother   . Stroke Mother   . Hypertension Father   . Heart attack Father   . Hypertension Sister   . Heart disease Maternal Grandmother   . Diabetes Maternal Grandmother   . Hypertension Maternal Grandmother   . Cancer Maternal Grandfather     Social History   Tobacco Use  . Smoking status: Never Smoker  . Smokeless tobacco: Never Used  Substance Use Topics  . Alcohol use: Yes    Alcohol/week: 0.0 standard drinks    Comment: occasionally     Current Outpatient Medications:  .  amLODipine-valsartan (EXFORGE) 5-160 MG tablet, Take 1 tablet by mouth daily., Disp: 90 tablet, Rfl: 0 .  azelastine (ASTELIN) 0.1 % nasal spray, Place 2 sprays into both nostrils 2 (two) times daily. Use in each nostril as directed, Disp: 90 mL, Rfl: 0 .  cetirizine (ZYRTEC) 10 MG tablet, Take 10 mg by mouth 2 (two) times daily. Am and pm, Disp: , Rfl:  .  famotidine (PEPCID) 40 MG tablet, Take 1 tablet (40 mg total) by mouth daily., Disp: 90 tablet, Rfl: 0 .  fluticasone (FLONASE) 50 MCG/ACT nasal spray, Place 2 sprays into both nostrils daily., Disp: 48 g, Rfl: 0 .  norethindrone-ethinyl estradiol (NORTREL 0.5/35, 28,) 0.5-35 MG-MCG tablet, Take 1 tablet by mouth daily., Disp: 84 tablet, Rfl: 1 .   triamterene-hydrochlorothiazide (DYAZIDE) 37.5-25 MG capsule, Take 1 each (1 capsule total) by mouth daily., Disp: 90 capsule, Rfl: 1 .  valACYclovir (VALTREX) 500 MG tablet, tid, Disp: 100 tablet, Rfl: 3  Allergies  Allergen Reactions  . Ace Inhibitors     Per ENT , cough   . Percocet [Oxycodone-Acetaminophen] Itching and Other (See Comments)    Hot Flashes    I personally reviewed active problem list, medication list, allergies, family history, social history, health maintenance with the patient/caregiver today.   ROS  Constitutional: Negative for fever or weight change.  Respiratory: Negative for cough and shortness of breath.   Cardiovascular: Negative for chest pain or palpitations.  Gastrointestinal: Negative for abdominal pain, no bowel changes.  Musculoskeletal: Negative for gait problem or joint swelling.  Skin: Negative for rash.  Neurological: Negative for dizziness or headache.  No other specific complaints in a complete review of systems (except as listed in HPI above).  Objective  Vitals:   07/30/19 1141  BP: 132/86  Pulse: 90  Resp: 16  Temp: 97.6 F (36.4 C)  TempSrc: Temporal  SpO2: 99%  Weight: 255 lb 3.2 oz (115.8 kg)  Height: 5\' 8"  (1.727 m)    Body mass index is 38.8 kg/m.  Physical Exam  Constitutional: Patient appears well-developed and well-nourished. Obese No distress.  HEENT: head atraumatic, normocephalic, pupils equal and reactive to light Cardiovascular: Normal rate, regular rhythm and normal heart sounds.  No murmur heard. No BLE edema. Pulmonary/Chest: Effort normal and breath sounds normal. No respiratory distress. Abdominal: Soft.  There is no tenderness. Psychiatric: Patient has a normal mood and affect. behavior is normal. Judgment and thought content normal.  PHQ2/9: Depression screen [facility] 2/9 07/30/2019 04/18/2019 11/15/2018 10/18/2018 04/14/2018  Decreased Interest 1 0 0 0 0  Down, Depressed, Hopeless 0 0 0 0 0  PHQ - 2 Score 1  0 0 0 0  Altered sleeping 1 0 0 0 0  Tired, decreased energy 1 0 0 0 0  Change in appetite 0 0 0 0 0  Feeling bad or failure about yourself  0 0 0 0 0  Trouble concentrating 1 0 0 0 0  Moving slowly or fidgety/restless 0 0 0 0 0  Suicidal thoughts 0 0 0 0 0  PHQ-9 Score 4 0 0 0 0  Difficult doing work/chores - - Not difficult at all Not difficult at all Not difficult at all    phq 9 is positive   Fall Risk: Fall Risk  07/30/2019 04/18/2019 11/15/2018 10/18/2018 04/14/2018  Falls in the past year? 0 0 0 0 0  Number falls in past yr: 0 0 0 0 0  Injury with Fall? 0 0 0 0 0  Follow up Falls evaluation completed - - - -     Functional Status Survey: Is  the patient deaf or have difficulty hearing?: No Does the patient have difficulty seeing, even when wearing glasses/contacts?: No Does the patient have difficulty concentrating, remembering, or making decisions?: No Does the patient have difficulty walking or climbing stairs?: No Does the patient have difficulty dressing or bathing?: No Does the patient have difficulty doing errands alone such as visiting a doctor's office or shopping?: No   Assessment & Plan  1. Essential hypertension  - amLODipine-valsartan (EXFORGE) 5-160 MG tablet; Take 1 tablet by mouth daily.  Dispense: 90 tablet; Refill: 1 - CBC with Differential/Platelet - COMPLETE METABOLIC PANEL WITH GFR  2. History of uterine fibroid  - Ambulatory referral to Obstetrics / Gynecology  3. Metabolic syndrome  - Hemoglobin A1c  4. Dyslipidemia  - Lipid panel  5. Perennial allergic rhinitis  Continue medication, discussed nasal saline   6. History of COVID-19  Dry nose and advised saline spray   7. Morbid obesity ([place])  Discussed with the patient the risk posed by an increased BMI. Discussed importance of portion control, calorie counting and at least 150 minutes of physical activity weekly. Avoid sweet beverages and drink more water. Eat at least 6 servings  of fruit and vegetables daily   8. Thrombocytosis (HCC)  - COMPLETE METABOLIC PANEL WITH GFR  9. Vitamin D deficiency  - VITAMIN D 25 Hydroxy (Vit-D Deficiency, Fractures)  10. Dysthymia  Discussed exercise and mindfulness

## 2019-07-31 ENCOUNTER — Telehealth: Payer: Self-pay | Admitting: Obstetrics & Gynecology

## 2019-07-31 LAB — LIPID PANEL
Cholesterol: 194 mg/dL (ref <200)
HDL: 57 mg/dL (ref >=50)
LDL Cholesterol (Calc): 110 mg/dL (calc) — ABNORMAL HIGH
Non-HDL Cholesterol (Calc): 137 mg/dL (calc) — ABNORMAL HIGH (ref <130)
Total CHOL/HDL Ratio: 3.4 (calc) (ref <5.0)
Triglycerides: 154 mg/dL — ABNORMAL HIGH (ref <150)

## 2019-07-31 LAB — CBC WITH DIFFERENTIAL/PLATELET
Absolute Monocytes: 490 cells/uL (ref 200–950)
Basophils Absolute: 60 cells/uL (ref 0–200)
Basophils Relative: 0.7 %
Eosinophils Absolute: 43 cells/uL (ref 15–500)
Eosinophils Relative: 0.5 %
HCT: 38.4 % (ref 35.0–45.0)
Hemoglobin: 13 g/dL (ref 11.7–15.5)
Lymphs Abs: 2107 cells/uL (ref 850–3900)
MCH: 28.9 pg (ref 27.0–33.0)
MCHC: 33.9 g/dL (ref 32.0–36.0)
MCV: 85.3 fL (ref 80.0–100.0)
MPV: 10.3 fL (ref 7.5–12.5)
Monocytes Relative: 5.7 %
Neutro Abs: 5900 cells/uL (ref 1500–7800)
Neutrophils Relative %: 68.6 %
Platelets: 399 10*3/uL (ref 140–400)
RBC: 4.5 10*6/uL (ref 3.80–5.10)
RDW: 12.3 % (ref 11.0–15.0)
Total Lymphocyte: 24.5 %
WBC: 8.6 10*3/uL (ref 3.8–10.8)

## 2019-07-31 LAB — COMPLETE METABOLIC PANEL WITH GFR
AG Ratio: 1.3 (calc) (ref 1.0–2.5)
ALT: 18 U/L (ref 6–29)
AST: 16 U/L (ref 10–30)
Albumin: 4.4 g/dL (ref 3.6–5.1)
Alkaline phosphatase (APISO): 52 U/L (ref 31–125)
BUN: 10 mg/dL (ref 7–25)
CO2: 28 mmol/L (ref 20–32)
Calcium: 9.8 mg/dL (ref 8.6–10.2)
Chloride: 98 mmol/L (ref 98–110)
Creat: 0.7 mg/dL (ref 0.50–1.10)
GFR, Est African American: 128 mL/min/{1.73_m2} (ref >=60)
GFR, Est Non African American: 111 mL/min/{1.73_m2} (ref >=60)
Globulin: 3.4 g/dL (calc) (ref 1.9–3.7)
Glucose, Bld: 70 mg/dL (ref 65–99)
Potassium: 3.7 mmol/L (ref 3.5–5.3)
Sodium: 138 mmol/L (ref 135–146)
Total Bilirubin: 0.5 mg/dL (ref 0.2–1.2)
Total Protein: 7.8 g/dL (ref 6.1–8.1)

## 2019-07-31 LAB — HEMOGLOBIN A1C
Hgb A1c MFr Bld: 4.9 % of total Hgb (ref <5.7)
Mean Plasma Glucose: 94 (calc)
eAG (mmol/L): 5.2 (calc)

## 2019-07-31 LAB — VITAMIN D 25 HYDROXY (VIT D DEFICIENCY, FRACTURES): Vit D, 25-Hydroxy: 40 ng/mL (ref 30–100)

## 2019-07-31 NOTE — Telephone Encounter (Signed)
[facility] referring for History of uterine fibroid, due for pap smear. Called and left voicemail for patient to call back to be schedule

## 2019-08-02 NOTE — Telephone Encounter (Signed)
Called and left voice mail for patient to call back to be schedule °

## 2019-08-02 NOTE — Telephone Encounter (Signed)
Patient scheduled with CS 4/6.

## 2019-08-27 ENCOUNTER — Ambulatory Visit: Payer: BC Managed Care – PPO | Admitting: Family Medicine

## 2019-09-04 ENCOUNTER — Ambulatory Visit (INDEPENDENT_AMBULATORY_CARE_PROVIDER_SITE_OTHER): Payer: BC Managed Care – PPO | Admitting: Obstetrics and Gynecology

## 2019-09-04 ENCOUNTER — Encounter: Payer: Self-pay | Admitting: Obstetrics and Gynecology

## 2019-09-04 ENCOUNTER — Other Ambulatory Visit: Payer: Self-pay

## 2019-09-04 VITALS — BP 140/88 | Ht 68.0 in | Wt 261.0 lb

## 2019-09-04 DIAGNOSIS — R300 Dysuria: Secondary | ICD-10-CM | POA: Diagnosis not present

## 2019-09-04 DIAGNOSIS — N852 Hypertrophy of uterus: Secondary | ICD-10-CM

## 2019-09-04 DIAGNOSIS — L750 Bromhidrosis: Secondary | ICD-10-CM | POA: Diagnosis not present

## 2019-09-04 NOTE — Patient Instructions (Signed)

## 2019-09-04 NOTE — Progress Notes (Signed)
Patient ID: Kelsey Barnes, female   DOB: 10/20/1980, 39 y.o.   MRN: [id number]  Reason for Consult: Referral (H/o uterine fibroids, retention around period, periods are very heavy, some cramping/pulling pain)   Referred by Steele Sizer, MD  Subjective:     HPI:  Kelsey Barnes is a 39 y.o. female she was first referred today for discussion regarding possible uterine fibroids.  She reports that she had a uterine fibroid in the past which was removed by a open myomectomy in 2014.  She feels like last time the fibroid grew very large and she would like to try intervene earlier with this fibroid which may now be present.  She reports that she has been having some cramping and pulling sensation in her abdomen.  She feels pain and pressure in her pelvis.  She has had some issues with urinary retention and feeling like she is not completely emptying her bladder.  She reports that sometimes she has to sit for a long time on the toilet to empty her bladder completely.  She has noticed foul odor to her urine.  She is unsure if this foul odor is actually present.  She recently had a normal urine culture with her primary care physician.  She reports that she has had difficulties with smell since having Covid in November.  She reports that last month was the first month that she had a heavy menstrual cycle.  Normally her menstrual cycle occurs monthly.  It will last for 4 to 5 days.  She generally has mild cramping and some feelings of pelvic fullness.  With her most recent cycle she had to use super plus tampons which she changed every 2-3 hours.  She denies passing any large clots but she does have gushing and flooding sensations when she changes her tampon.  She has no history of anemia or history of blood transfusions.  She denies any accidents at work and denies any absenteeism relating to her menstrual cycle.  She reports that she has never been pregnant and does not currently desire to be  pregnant.  She has a history of genital herpes and takes Valtrex as daily suppression therapy.  She has been taking suppression therapy since 2008.  Gynecological History Menarche: [age] Menopause: Not applicable LMP: 0000000 Describes periods as regular monthly Last pap smear: 2018 and NIL HPV negative Last Mammogram: Never History of STDs: HSV 2 Sexually Active: Not currently sexually active no history of pain with intercourse  Obstetrical History G0 P0  Past Medical History:  Diagnosis Date  . Class 2 obesity   . Dyslipidemia   . Goiter   . Herpes   . Hypertension    CONTROLLED ON MEDS  . Tonsillar hypertrophy   . Tonsillitis    Family History  Problem Relation Age of Onset  . Hypertension Mother   . Diabetes Mother   . COPD Mother   . Stroke Mother   . Hypertension Father   . Heart attack Father   . Hypertension Sister   . Heart disease Maternal Grandmother   . Diabetes Maternal Grandmother   . Hypertension Maternal Grandmother   . Cancer Maternal Grandfather    Past Surgical History:  Procedure Laterality Date  . APPENDECTOMY  2008  . CHOLECYSTECTOMY  2016  . MYOMECTOMY  12.19.2014  . TONSILLECTOMY AND ADENOIDECTOMY N/A 10/15/2015   Procedure: TONSILLECTOMY ;  Surgeon: Carloyn Manner, MD;  Location: [facility];  Service: ENT;  Laterality: N/A;  Reggie Pile  Short Social History:  Social History   Tobacco Use  . Smoking status: Never Smoker  . Smokeless tobacco: Never Used  Substance Use Topics  . Alcohol use: Yes    Alcohol/week: 0.0 standard drinks    Comment: occasionally    Allergies  Allergen Reactions  . Ace Inhibitors     Per ENT , cough   . Percocet [Oxycodone-Acetaminophen] Itching and Other (See Comments)    Hot Flashes    Current Outpatient Medications  Medication Sig Dispense Refill  . amLODipine-valsartan (EXFORGE) 5-160 MG tablet Take 1 tablet by mouth daily. 90 tablet 1  . azelastine (ASTELIN) 0.1 % nasal spray Place  2 sprays into both nostrils 2 (two) times daily. Use in each nostril as directed 90 mL 0  . cetirizine (ZYRTEC) 10 MG tablet Take 10 mg by mouth 2 (two) times daily. Am and pm    . famotidine (PEPCID) 40 MG tablet Take 1 tablet (40 mg total) by mouth daily. 90 tablet 0  . fluticasone (FLONASE) 50 MCG/ACT nasal spray Place 2 sprays into both nostrils daily. 48 g 0  . norethindrone-ethinyl estradiol (NORTREL 0.5/35, 28,) 0.5-35 MG-MCG tablet Take 1 tablet by mouth daily. 84 tablet 1  . triamterene-hydrochlorothiazide (DYAZIDE) 37.5-25 MG capsule Take 1 each (1 capsule total) by mouth daily. 90 capsule 1  . valACYclovir (VALTREX) 500 MG tablet tid 100 tablet 3   No current facility-administered medications for this visit.    Review of Systems  Constitutional: Negative for chills, fatigue, fever and unexpected weight change.  HENT: Negative for trouble swallowing.  Eyes: Negative for loss of vision.  Respiratory: Negative for cough, shortness of breath and wheezing.  Cardiovascular: Negative for chest pain, leg swelling, palpitations and syncope.  GI: Negative for abdominal pain, blood in stool, diarrhea, nausea and vomiting.  GU: Negative for difficulty urinating, dysuria, frequency and hematuria.  Musculoskeletal: Negative for back pain, leg pain and joint pain.  Skin: Negative for rash.  Neurological: Negative for dizziness, headaches, light-headedness, numbness and seizures.  Psychiatric: Negative for behavioral problem, confusion, depressed mood and sleep disturbance.        Objective:  Objective   Vitals:   09/04/19 1451  BP: 140/88  Weight: 261 lb (118.4 kg)  Height: 5\' 8"  (1.727 m)   Body mass index is 39.68 kg/m.  Physical Exam External: Vulva NORMAL. No lesions noted.  Speculum examination:  Cervix normal . NO in the vaginal vault. No discharge.   Bimanual examination: Uterus enlarged. Large posterior fibroid palpated on exam. no CMT. Adnexa limited, no masses  appreciated. Pelvis was not fixed.      Assessment/Plan:     39 year old with enlarged bulky uterus likely secondary to uterine fibroids Patient will  return for pelvic US. Discussed options for management of uterine fibroids.  This discussion included options for medical management with Freida Busman or  Depo-Lupron, myomectomy uterine artery embolization, and potentially hysterectomy.  Once the size shape and location of the fibroids is determined by imaging we will discuss these options further and decide which therapy would be best suited for the patient.  Patient was provided with handouts regarding uterine fibroids.  More than 45 minutes were spent face to face with the patient in the room, reviewing the medical record, labs and images, and coordinating care for the patient. The plan of management was discussed in detail and counseling was provided.    Adrian Prows MD [facility], [facility] 09/04/2019 3:36 PM

## 2019-09-06 LAB — URINE CULTURE: Organism ID, Bacteria: NO GROWTH

## 2019-09-25 ENCOUNTER — Other Ambulatory Visit: Payer: Self-pay

## 2019-09-25 ENCOUNTER — Other Ambulatory Visit: Payer: Self-pay | Admitting: Obstetrics and Gynecology

## 2019-09-25 ENCOUNTER — Ambulatory Visit (INDEPENDENT_AMBULATORY_CARE_PROVIDER_SITE_OTHER): Payer: BC Managed Care – PPO

## 2019-09-25 ENCOUNTER — Encounter: Payer: Self-pay | Admitting: Obstetrics and Gynecology

## 2019-09-25 ENCOUNTER — Ambulatory Visit (INDEPENDENT_AMBULATORY_CARE_PROVIDER_SITE_OTHER): Payer: BC Managed Care – PPO | Admitting: Obstetrics and Gynecology

## 2019-09-25 VITALS — BP 128/80 | Ht 68.0 in | Wt 262.0 lb

## 2019-09-25 DIAGNOSIS — N920 Excessive and frequent menstruation with regular cycle: Secondary | ICD-10-CM | POA: Diagnosis not present

## 2019-09-25 DIAGNOSIS — N852 Hypertrophy of uterus: Secondary | ICD-10-CM

## 2019-09-25 DIAGNOSIS — D251 Intramural leiomyoma of uterus: Secondary | ICD-10-CM

## 2019-09-25 NOTE — Progress Notes (Signed)
Patient ID: Kelsey Barnes, female   DOB: 1980/06/02, 39 y.o.   MRN: [id number]  Reason for Consult: Gynecologic Exam   Referred by Steele Sizer, MD  Subjective:     HPI:  Kelsey Barnes is a 39 y.o. female. She is following up today for a pelvic US and for further discussion of her menorrhagia and management options.   Past Medical History:  Diagnosis Date  . Class 2 obesity   . Dyslipidemia   . Goiter   . Herpes   . Hypertension    CONTROLLED ON MEDS  . Tonsillar hypertrophy   . Tonsillitis    Family History  Problem Relation Age of Onset  . Hypertension Mother   . Diabetes Mother   . COPD Mother   . Stroke Mother   . Hypertension Father   . Heart attack Father   . Hypertension Sister   . Heart disease Maternal Grandmother   . Diabetes Maternal Grandmother   . Hypertension Maternal Grandmother   . Cancer Maternal Grandfather    Past Surgical History:  Procedure Laterality Date  . APPENDECTOMY  2008  . CHOLECYSTECTOMY  2016  . MYOMECTOMY  12.19.2014  . TONSILLECTOMY AND ADENOIDECTOMY N/A 10/15/2015   Procedure: TONSILLECTOMY ;  Surgeon: Carloyn Manner, MD;  Location: [facility];  Service: ENT;  Laterality: N/A;  UPREG    Short Social History:  Social History   Tobacco Use  . Smoking status: Never Smoker  . Smokeless tobacco: Never Used  Substance Use Topics  . Alcohol use: Yes    Alcohol/week: 0.0 standard drinks    Comment: occasionally    Allergies  Allergen Reactions  . Ace Inhibitors     Per ENT , cough   . Percocet [Oxycodone-Acetaminophen] Itching and Other (See Comments)    Hot Flashes    Current Outpatient Medications  Medication Sig Dispense Refill  . amLODipine-valsartan (EXFORGE) 5-160 MG tablet Take 1 tablet by mouth daily. 90 tablet 1  . azelastine (ASTELIN) 0.1 % nasal spray Place 2 sprays into both nostrils 2 (two) times daily. Use in each nostril as directed 90 mL 0  . cetirizine (ZYRTEC) 10 MG tablet  Take 10 mg by mouth 2 (two) times daily. Am and pm    . famotidine (PEPCID) 40 MG tablet Take 1 tablet (40 mg total) by mouth daily. 90 tablet 0  . fluticasone (FLONASE) 50 MCG/ACT nasal spray Place 2 sprays into both nostrils daily. 48 g 0  . norethindrone-ethinyl estradiol (NORTREL 0.5/35, 28,) 0.5-35 MG-MCG tablet Take 1 tablet by mouth daily. 84 tablet 1  . triamterene-hydrochlorothiazide (DYAZIDE) 37.5-25 MG capsule Take 1 each (1 capsule total) by mouth daily. 90 capsule 1  . valACYclovir (VALTREX) 500 MG tablet tid 100 tablet 3   No current facility-administered medications for this visit.    Review of Systems  Constitutional: Negative for chills, fatigue, fever and unexpected weight change.  HENT: Negative for trouble swallowing.  Eyes: Negative for loss of vision.  Respiratory: Negative for cough, shortness of breath and wheezing.  Cardiovascular: Negative for chest pain, leg swelling, palpitations and syncope.  GI: Negative for abdominal pain, blood in stool, diarrhea, nausea and vomiting.  GU: Negative for difficulty urinating, dysuria, frequency and hematuria.  Musculoskeletal: Negative for back pain, leg pain and joint pain.  Skin: Negative for rash.  Neurological: Negative for dizziness, headaches, light-headedness, numbness and seizures.  Psychiatric: Negative for behavioral problem, confusion, depressed mood and sleep disturbance.  Objective:  Objective   Vitals:   09/25/19 1441  BP: 128/80  Weight: 262 lb (118.8 kg)  Height: 5\' 8"  (1.727 m)   Body mass index is 39.84 kg/m.  Physical Exam Vitals and nursing note reviewed.  Constitutional:      Appearance: She is well-developed.  HENT:     Head: Normocephalic and atraumatic.  Eyes:     Pupils: Pupils are equal, round, and reactive to light.  Cardiovascular:     Rate and Rhythm: Normal rate and regular rhythm.  Pulmonary:     Effort: Pulmonary effort is normal. No respiratory distress.  Skin:     General: Skin is warm and dry.  Neurological:     Mental Status: She is alert and oriented to person, place, and time.  Psychiatric:        Behavior: Behavior normal.        Thought Content: Thought content normal.        Judgment: Judgment normal.        Assessment/Plan:     39 yo G0P0 with and enlarge fibroid uterus.  Discussed options for management of enlarged fibroid uterus. Discussed that her plans for future child bearing should impact her management. If she desires to conceive she could have a abdominal myomectomy. Discussed that she would need to wait 6 months after a myomectomy before conceiving. She is interested in potentially a laparoscopic myomectomy. Discussed that this is not a procedure that I could perform, but that I could refer her to the appropriate physician. Laparoscopic procedure could be complicated by previous open myomectomy. Myomectomy  Discussed options for [place] (UFE) or hysterectomy if she has completed child bearing.  IUD could be a reasonable nonsurgical option to control menorrhagia.  Patient is undecided about how she would like to manage uterine fibroid. She will consider her options. She would like to retain possibility of child bearing at this time. She may be interested in having a myomectomy. She reports she is currently busy with personal life, but will follow up shortly for further care to address this issue.   More than 30 minutes were spent face to face with the patient in the room, reviewing the medical record, labs and images, and coordinating care for the patient. The plan of management was discussed in detail and counseling was provided.    Adrian Prows MD [facility], [facility] 09/25/2019 3:29 PM

## 2019-10-27 ENCOUNTER — Other Ambulatory Visit: Payer: Self-pay | Admitting: Family Medicine

## 2019-10-27 DIAGNOSIS — I10 Essential (primary) hypertension: Secondary | ICD-10-CM

## 2019-10-27 DIAGNOSIS — Z3041 Encounter for surveillance of contraceptive pills: Secondary | ICD-10-CM

## 2019-10-27 NOTE — Telephone Encounter (Signed)
Requested Prescriptions  Pending Prescriptions Disp Refills  . NORTREL 0.5/35, 28, 0.5-35 MG-MCG tablet [Pharmacy Med Name: NORTREL (28) TAB 0.5/35] 84 tablet 0    Sig: TAKE 1 TABLET DAILY     OB/GYN:  Contraceptives Passed - 10/27/2019  8:11 AM      Passed - Last BP in normal range    BP Readings from Last 1 Encounters:  09/25/19 128/80         Passed - Valid encounter within last 12 months    Recent Outpatient Visits          2 months ago Morbid obesity [facility])   [facility] Steele Sizer, MD   6 months ago Morbid obesity [facility])   [facility] Steele Sizer, MD   11 months ago Well woman exam   [facility] Steele Sizer, MD   1 year ago Essential hypertension   [facility] Steele Sizer, MD   1 year ago Essential hypertension   [facility] Steele Sizer, MD      Future Appointments            In 3 months Ancil Boozer, Drue Stager, MD [facility] [place] (DYAZIDE) 37.5-25 MG capsule [Pharmacy Med Name: TRIAMT/HCTZ  CAP 37.5-25] 90 capsule 0    Sig: TAKE 1 CAPSULE DAILY     Cardiovascular: Diuretic Combos Passed - 10/27/2019  8:11 AM      Passed - K in normal range and within 360 days    Potassium  Date Value Ref Range Status  07/30/2019 3.7 3.5 - 5.3 mmol/L Final  02/08/2014 3.8 3.5 - 5.1 mmol/L Final         Passed - Na in normal range and within 360 days    Sodium  Date Value Ref Range Status  07/30/2019 138 135 - 146 mmol/L Final  04/22/2017 140 137 - 147 Final  02/08/2014 140 136 - 145 mmol/L Final         Passed - Cr in normal range and within 360 days    Creat  Date Value Ref Range Status  07/30/2019 0.70 0.50 - 1.10 mg/dL Final         Passed - Ca in normal range and within 360 days    Calcium  Date Value Ref Range Status  07/30/2019 9.8 8.6 - 10.2 mg/dL Final   Calcium, Total  Date Value  Ref Range Status  02/08/2014 9.0 8.5 - 10.1 mg/dL Final         Passed - Last BP in normal range    BP Readings from Last 1 Encounters:  09/25/19 128/80         Passed - Valid encounter within last 6 months    Recent Outpatient Visits          2 months ago Morbid obesity [facility])   [facility] Steele Sizer, MD   6 months ago Morbid obesity [facility])   [facility] Steele Sizer, MD   11 months ago Well woman exam   [facility] Steele Sizer, MD   1 year ago Essential hypertension   [facility] Steele Sizer, MD   1 year ago Essential hypertension   [facility] Steele Sizer, MD      Future Appointments            In 3  months Steele Sizer, MD [place]

## 2019-11-16 ENCOUNTER — Telehealth: Payer: Self-pay

## 2019-11-16 NOTE — Telephone Encounter (Signed)
Pt calling regarding a couple of bills regarding to care that cannot be directed to the billing number.  [phone]  Left detailed generic msg that the person I need to send this message to is out of the office today and will return Monday.  Will forward msg.

## 2019-11-19 NOTE — Telephone Encounter (Signed)
Lmtrc

## 2019-12-04 NOTE — Telephone Encounter (Signed)
Patient called to ask if her 09/04/19 visit could be changed to an annual exam due to having a pap, and because she has a balance due to her high deductible. Explained to patient that the referral linked to the visit, the reason for visit in the visit note, and the note itself indicated the visit was not an annual exam. Also explained to patient it had not been a year between her last annual exam with her pcp on 11/15/18 and the referral visit on 09/04/19.  Encouraged the patient to call the phone# on her statement for payment options.

## 2019-12-04 NOTE — Telephone Encounter (Signed)
Lmtrc

## 2019-12-20 ENCOUNTER — Other Ambulatory Visit: Payer: Self-pay | Admitting: Family Medicine

## 2019-12-20 ENCOUNTER — Telehealth: Payer: Self-pay

## 2019-12-20 DIAGNOSIS — Z111 Encounter for screening for respiratory tuberculosis: Secondary | ICD-10-CM

## 2019-12-20 NOTE — Telephone Encounter (Signed)
Copied from [place] [id number]. Topic: Appointment Scheduling - Scheduling Inquiry for Clinic >> Dec 20, 2019 10:02 AM Percell Belt A wrote: Reason for CRM: pt called and would like to know if a order could be put in for a TB blood test that she needs for school?   Best number [phone]

## 2019-12-25 DIAGNOSIS — Z111 Encounter for screening for respiratory tuberculosis: Secondary | ICD-10-CM | POA: Diagnosis not present

## 2019-12-27 LAB — QUANTIFERON-TB GOLD PLUS
Mitogen-NIL: 8.87 IU/mL
NIL: 0.08 IU/mL
QuantiFERON-TB Gold Plus: NEGATIVE
TB1-NIL: <0 IU/mL
TB2-NIL: <0 IU/mL

## 2020-01-18 ENCOUNTER — Other Ambulatory Visit: Payer: Self-pay | Admitting: Family Medicine

## 2020-01-18 DIAGNOSIS — I10 Essential (primary) hypertension: Secondary | ICD-10-CM

## 2020-01-29 NOTE — Progress Notes (Signed)
Name: Kelsey Barnes   MRN: [id number]    DOB: 1980-11-11   Date:01/30/2020       Progress Note  Subjective  Chief Complaint  Chief Complaint  Patient presents with  . Hypertension  . Obesity    HPI   HTN: she is off ACEshe was advised by ENT to stop and is now on Triamterene/hctz and Exforge 5/160 mg goal,  no chest pain, palpitation of SOB. Discussed teratogenic effects again   AR: she has two nasal sprays and takes Zyrtec at [facility] she is not using nasal sprays lately, states she has recurrent left sinus pressure, had a facial injury many years ago. She has been sneezing more and will resume nasal spray   Herpes : no outbreaks as long as she takes medication. She needs to a refill in Nov and I will send it today   Metabolic syndrome/obesity: last labs were normal,she states her diet has not been good lately, she lives by herself now ( mother died 07-Jul-2018), she gained weight since last visit again, she states since COVID 19 infection last Nov she still has some problems with sense of smell and taste, she states her grandmother stayed with her for a period of time and was eating healthier.   Dyslipidemia:LDL improved, triglycerides was high in March 2021 HDL is at goal. She  is working 30 hours a week and going to school. Diet is not great at this time, but has been eating more salads   Fibroid tumors: seeing Dr. Gilman Schmidt she has recurrence of fibroids, cycles are heavy at times. She is thinking about surgery.  Urinary frequency: she has noticed more urgency, frequency and hesitancy, no hematuria or odor. She is wondering if it could be the fibroid tumor, we will check urine culture    Patient Active Problem List   Diagnosis Date Noted  . Morbid obesity ([place]) 07/30/2019  . Thrombocytosis ([place]) 07/30/2019  . Need for hepatitis B vaccination 12/27/2017  . Vitamin D deficiency 12/13/2014  . Perennial allergic rhinitis 12/13/2014  . Metabolic syndrome  44/96/7591  . Herpes genitalis in women 12/13/2014  . Goiter 12/13/2014  . Dyslipidemia 12/13/2014  . Hematuria, microscopic 12/13/2014  . Hypertension 09/06/2013  . Obesity (BMI 30-39.9) 09/06/2013  . H/O myomectomy 05/14/2013    Past Surgical History:  Procedure Laterality Date  . APPENDECTOMY  2008  . CHOLECYSTECTOMY  2016  . MYOMECTOMY  12.19.2014  . TONSILLECTOMY AND ADENOIDECTOMY N/A 10/15/2015   Procedure: TONSILLECTOMY ;  Surgeon: Carloyn Manner, MD;  Location: [place];  Service: ENT;  Laterality: N/A;  UPREG    Family History  Problem Relation Age of Onset  . Hypertension Mother   . Diabetes Mother   . COPD Mother   . Stroke Mother   . Hypertension Father   . Heart attack Father   . Hypertension Sister   . Heart disease Maternal Grandmother   . Diabetes Maternal Grandmother   . Hypertension Maternal Grandmother   . Cancer Maternal Grandfather     Social History   Tobacco Use  . Smoking status: Never Smoker  . Smokeless tobacco: Never Used  Substance Use Topics  . Alcohol use: Yes    Alcohol/week: 0.0 standard drinks    Comment: occasionally     Current Outpatient Medications:  .  amLODipine-valsartan (EXFORGE) 5-160 MG tablet, Take 1 tablet by mouth daily., Disp: 90 tablet, Rfl: 1 .  cetirizine (ZYRTEC) 10 MG tablet, Take 10 mg by mouth 2 (  two) times daily. Am and pm, Disp: , Rfl:  .  NORTREL 0.5/35, 28, 0.5-35 MG-MCG tablet, TAKE 1 TABLET DAILY, Disp: 84 tablet, Rfl: 0 .  triamterene-hydrochlorothiazide (DYAZIDE) 37.5-25 MG capsule, TAKE 1 CAPSULE DAILY, Disp: 90 capsule, Rfl: 0 .  valACYclovir (VALTREX) 500 MG tablet, tid, Disp: 100 tablet, Rfl: 3  Allergies  Allergen Reactions  . Ace Inhibitors     Per ENT , cough   . Percocet [Oxycodone-Acetaminophen] Itching and Other (See Comments)    Hot Flashes    I personally reviewed active problem list, medication list, allergies, family history, social history, health maintenance with the  patient/caregiver today.   ROS  Constitutional: Negative for fever, positive for  weight change.  Respiratory: Negative for cough and shortness of breath.   Cardiovascular: Negative for chest pain or palpitations.  Gastrointestinal: Negative for abdominal pain, no bowel changes.  Musculoskeletal: Negative for gait problem or joint swelling.  Skin: Negative for rash.  Neurological: Negative for dizziness or headache.  No other specific complaints in a complete review of systems (except as listed in HPI above).   Objective  Vitals:   01/30/20 1336  BP: 120/70  Pulse: 98  Resp: 16  Temp: 98.3 F (36.8 C)  TempSrc: Oral  SpO2: 97%  Weight: 263 lb 4.8 oz (119.4 kg)  Height: 5\' 8"  (1.727 m)    Body mass index is 40.03 kg/m.  Physical Exam  Constitutional: Patient appears well-developed and well-nourished. Obese  No distress.  HEENT: head atraumatic, normocephalic, pupils equal and reactive to light,neck supple Cardiovascular: Normal rate, regular rhythm and normal heart sounds.  No murmur heard. No BLE edema. Pulmonary/Chest: Effort normal and breath sounds normal. No respiratory distress. Abdominal: Soft.  There is no tenderness. Psychiatric: Patient has a normal mood and affect. behavior is normal. Judgment and thought content normal.  Recent Results (from the past 2160 hour(s))  QuantiFERON-TB Gold Plus     Status: None   Collection Time: 12/25/19  2:35 PM  Result Value Ref Range   QuantiFERON-TB Gold Plus NEGATIVE NEGATIVE    Comment: Negative test result. M. tuberculosis complex  infection unlikely.    NIL 0.08 IU/mL   Mitogen-NIL 8.87 IU/mL   TB1-NIL <0.00 IU/mL   TB2-NIL <0.00 IU/mL    Comment: . The Nil tube value reflects the background interferon gamma immune response of the patient's blood sample. This value has been subtracted from the patient's displayed TB and Mitogen results. . Lower than expected results with the Mitogen tube prevent  false-negative Quantiferon readings by detecting a patient with a potential immune suppressive condition and/or suboptimal pre-analytical specimen handling. . The TB1 Antigen tube is coated with the M. tuberculosis-specific antigens designed to elicit responses from TB antigen primed CD4+ helper T-lymphocytes. . The TB2 Antigen tube is coated with the M. tuberculosis-specific antigens designed to elicit responses from TB antigen primed CD4+ helper and CD8+ cytotoxic T-lymphocytes. . For additional information, please refer to https://education.questdiagn[employer].com/faq/FAQ204 (This link is being provided for informational/ educational purposes only.) .       PHQ2/9: Depression screen [facility] 2/9 01/30/2020 07/30/2019 04/18/2019 11/15/2018 10/18/2018  Decreased Interest 0 1 0 0 0  Down, Depressed, Hopeless 0 0 0 0 0  PHQ - 2 Score 0 1 0 0 0  Altered sleeping - 1 0 0 0  Tired, decreased energy - 1 0 0 0  Change in appetite - 0 0 0 0  Feeling bad or failure about yourself  - 0 0  0 0  Trouble concentrating - 1 0 0 0  Moving slowly or fidgety/restless - 0 0 0 0  Suicidal thoughts - 0 0 0 0  PHQ-9 Score - 4 0 0 0  Difficult doing work/chores - - - Not difficult at all Not difficult at all  Some recent data might be hidden    phq 9 is negative   Fall Risk: Fall Risk  01/30/2020 01/30/2020 07/30/2019 04/18/2019 11/15/2018  Falls in the past year? 0 0 0 0 0  Number falls in past yr: 0 0 0 0 0  Injury with Fall? 0 0 0 0 0  Follow up - - Falls evaluation completed - -    Functional Status Survey: Is the patient deaf or have difficulty hearing?: No Does the patient have difficulty seeing, even when wearing glasses/contacts?: No Does the patient have difficulty concentrating, remembering, or making decisions?: No Does the patient have difficulty walking or climbing stairs?: No Does the patient have difficulty dressing or bathing?: No Does the patient have difficulty doing errands alone  such as visiting a doctor's office or shopping?: No    Assessment & Plan  1. Need for Tdap vaccination  - Tdap vaccine greater than or equal to 7yo IM  2. Need for immunization against influenza  - Flu Vaccine QUAD 36+ mos IM  3. Uterine leiomyoma, unspecified location  Keep follow up with Dr. Gilman Schmidt, may go for hysterectomy soon   4. Urinary frequency  - CULTURE, URINE COMPREHENSIVE  5. Metabolic syndrome   6. Dyslipidemia   7. Essential hypertension  - amLODipine-valsartan (EXFORGE) 5-160 MG tablet; Take 1 tablet by mouth daily.  Dispense: 90 tablet; Refill: 1 - triamterene-hydrochlorothiazide (DYAZIDE) 37.5-25 MG capsule; Take 1 each (1 capsule total) by mouth daily.  Dispense: 90 capsule; Refill: 1  8. Morbid obesity ([place])  Discussed with the patient the risk posed by an increased BMI. Discussed importance of portion control, calorie counting and at least 150 minutes of physical activity weekly. Avoid sweet beverages and drink more water. Eat at least 6 servings of fruit and vegetables daily   9. Vitamin D deficiency  Continue supplementation   10. Herpes genitalis in women  - valACYclovir (VALTREX) 500 MG tablet; tid  Dispense: 100 tablet; Refill: 3

## 2020-01-30 ENCOUNTER — Other Ambulatory Visit: Payer: Self-pay

## 2020-01-30 ENCOUNTER — Ambulatory Visit (INDEPENDENT_AMBULATORY_CARE_PROVIDER_SITE_OTHER): Payer: BC Managed Care – PPO | Admitting: Family Medicine

## 2020-01-30 ENCOUNTER — Encounter: Payer: Self-pay | Admitting: Family Medicine

## 2020-01-30 VITALS — BP 120/70 | HR 98 | Temp 98.3°F | Resp 16 | Ht 68.0 in | Wt 263.3 lb

## 2020-01-30 DIAGNOSIS — I10 Essential (primary) hypertension: Secondary | ICD-10-CM

## 2020-01-30 DIAGNOSIS — Z23 Encounter for immunization: Secondary | ICD-10-CM

## 2020-01-30 DIAGNOSIS — R350 Frequency of micturition: Secondary | ICD-10-CM | POA: Diagnosis not present

## 2020-01-30 DIAGNOSIS — E8881 Metabolic syndrome: Secondary | ICD-10-CM

## 2020-01-30 DIAGNOSIS — E785 Hyperlipidemia, unspecified: Secondary | ICD-10-CM

## 2020-01-30 DIAGNOSIS — A6009 Herpesviral infection of other urogenital tract: Secondary | ICD-10-CM

## 2020-01-30 DIAGNOSIS — D259 Leiomyoma of uterus, unspecified: Secondary | ICD-10-CM | POA: Diagnosis not present

## 2020-01-30 DIAGNOSIS — E559 Vitamin D deficiency, unspecified: Secondary | ICD-10-CM

## 2020-01-30 MED ORDER — TRIAMTERENE-HCTZ 37.5-25 MG PO CAPS: 1.0000 | ORAL_CAPSULE | Freq: Every day | ORAL | 1 refills | Status: DC

## 2020-01-30 MED ORDER — AMLODIPINE BESYLATE-VALSARTAN 5-160 MG PO TABS: 1.0000 | ORAL_TABLET | Freq: Every day | ORAL | 1 refills | Status: DC

## 2020-01-30 MED ORDER — VALACYCLOVIR HCL 500 MG PO TABS: ORAL_TABLET | ORAL | 3 refills | Status: DC

## 2020-02-04 ENCOUNTER — Other Ambulatory Visit: Payer: Self-pay | Admitting: Family Medicine

## 2020-02-04 DIAGNOSIS — Z3041 Encounter for surveillance of contraceptive pills: Secondary | ICD-10-CM

## 2020-04-01 ENCOUNTER — Encounter: Payer: Self-pay | Admitting: Family Medicine

## 2020-04-01 ENCOUNTER — Ambulatory Visit (INDEPENDENT_AMBULATORY_CARE_PROVIDER_SITE_OTHER): Payer: BC Managed Care – PPO | Admitting: Family Medicine

## 2020-04-01 ENCOUNTER — Other Ambulatory Visit: Payer: Self-pay

## 2020-04-01 VITALS — BP 140/90 | HR 98 | Temp 97.8°F | Resp 16 | Ht 68.0 in | Wt 265.9 lb

## 2020-04-01 DIAGNOSIS — R300 Dysuria: Secondary | ICD-10-CM

## 2020-04-01 LAB — POCT URINALYSIS DIPSTICK (MANUAL)
Nitrite, UA: NEGATIVE
Poct Blood: 250 — AB
Poct Glucose: NORMAL mg/dL
Poct Protein: 30 mg/dL — AB
Poct Urobilinogen: NORMAL mg/dL
Spec Grav, UA: 1.015 (ref 1.010–1.025)
pH, UA: 5 (ref 5.0–8.0)

## 2020-04-01 MED ORDER — CIPROFLOXACIN HCL 250 MG PO TABS: 250.0000 mg | ORAL_TABLET | Freq: Two times a day (BID) | ORAL | 0 refills | Status: DC

## 2020-04-01 NOTE — Progress Notes (Signed)
Name: Kelsey Barnes   MRN: [id number]    DOB: 1980/10/04   Date:04/01/2020       Progress Note  Subjective  Chief Complaint  UTI  HPI  Dysuria: she states noticed some urine odor over the weekend followed by cloudy urine, followed by dysuria, symptoms got worse yesterday, with some supra pubic discomfort, urinary frequency almost every 15 minutes, and later in the day she noticed hematuria. This morning she noticed some blood when she wiped. 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 No fever, chills or back pain. She has a history of uterine fibroid and not sure if pushing against her bladder. LMP 03/27/2020   Patient Active Problem List   Diagnosis Date Noted   Morbid obesity ([place]) 07/30/2019   Thrombocytosis 07/30/2019   Need for hepatitis B vaccination 12/27/2017   Vitamin D deficiency 12/13/2014   Perennial allergic rhinitis 43/15/4008   Metabolic syndrome 67/61/9509   Herpes genitalis in women 12/13/2014   Goiter 12/13/2014   Dyslipidemia 12/13/2014   Hematuria, microscopic 12/13/2014   Hypertension 09/06/2013   Obesity (BMI 30-39.9) 09/06/2013   H/O myomectomy 05/14/2013    Past Surgical History:  Procedure Laterality Date   APPENDECTOMY  2008   CHOLECYSTECTOMY  2016   MYOMECTOMY  12.19.2014   TONSILLECTOMY AND ADENOIDECTOMY N/A 10/15/2015   Procedure: TONSILLECTOMY ;  Surgeon: Carloyn Manner, MD;  Location: [place];  Service: ENT;  Laterality: N/A;  UPREG    Family History  Problem Relation Age of Onset   Hypertension Mother    Diabetes Mother    COPD Mother    Stroke Mother    Hypertension Father    Heart attack Father    Hypertension Sister    Heart disease Maternal Grandmother    Diabetes Maternal Grandmother    Hypertension Maternal Grandmother    Cancer Maternal Grandfather     Social History   Tobacco Use   Smoking status: Never Smoker   Smokeless tobacco: Never Used  Substance Use Topics   Alcohol  use: Yes    Alcohol/week: 0.0 standard drinks    Comment: occasionally     Current Outpatient Medications:    amLODipine-valsartan (EXFORGE) 5-160 MG tablet, Take 1 tablet by mouth daily., Disp: 90 tablet, Rfl: 1   cetirizine (ZYRTEC) 10 MG tablet, Take 10 mg by mouth 2 (two) times daily. Am and pm, Disp: , Rfl:    NORTREL 0.5/35, 28, 0.5-35 MG-MCG tablet, TAKE 1 TABLET DAILY, Disp: 84 tablet, Rfl: 0   triamterene-hydrochlorothiazide (DYAZIDE) 37.5-25 MG capsule, Take 1 each (1 capsule total) by mouth daily., Disp: 90 capsule, Rfl: 1   valACYclovir (VALTREX) 500 MG tablet, tid, Disp: 100 tablet, Rfl: 3  Allergies  Allergen Reactions   Ace Inhibitors     Per ENT , cough    Percocet [Oxycodone-Acetaminophen] Itching and Other (See Comments)    Hot Flashes    I personally reviewed active problem list, medication list, allergies, family history, social history, health maintenance with the patient/caregiver today.   ROS  Ten systems reviewed and is negative except as mentioned in HPI   Objective  Vitals:   04/01/20 0834  BP: 140/90  Pulse: 98  Resp: 16  Temp: 97.8 F (36.6 C)  TempSrc: Oral  SpO2: 100%  Weight: 265 lb 14.4 oz (120.6 kg)    Body mass index is 40.43 kg/m.  Physical Exam  Constitutional: Patient appears well-developed and well-nourished. Obese  No distress.  HEENT: head atraumatic, normocephalic, pupils equal and  reactive to light,  neck supple, throat within normal limits Cardiovascular: Normal rate, regular rhythm and normal heart sounds.  No murmur heard. No BLE edema. Pulmonary/Chest: Effort normal and breath sounds normal. No respiratory distress. Abdominal: Soft.  There is no tenderness. Negative CVA tenderness  Psychiatric: Patient has a normal mood and affect. behavior is normal. Judgment and thought content normal.  Recent Results (from the past 2160 hour(s))  POCT Urinalysis Dip Manual     Status: Abnormal   Collection Time: 04/01/20   8:36 AM  Result Value Ref Range   Spec Grav, UA 1.015 1.010 - 1.025   pH, UA 5.0 5.0 - 8.0   Leukocytes, UA Moderate (2+) (A) Negative   Nitrite, UA Negative Negative   Poct Protein +30 (A) Negative, trace mg/dL   Poct Glucose Normal Normal mg/dL   Poct Ketones + small (A) Negative   Poct Urobilinogen Normal Normal mg/dL   Poct Bilirubin + (A) Negative   Poct Blood =250 (A) Negative, trace     PHQ2/9: Depression screen [facility] 2/9 04/01/2020 01/30/2020 07/30/2019 04/18/2019 11/15/2018  Decreased Interest 0 0 1 0 0  Down, Depressed, Hopeless 0 0 0 0 0  PHQ - 2 Score 0 0 1 0 0  Altered sleeping - - 1 0 0  Tired, decreased energy - - 1 0 0  Change in appetite - - 0 0 0  Feeling bad or failure about yourself  - - 0 0 0  Trouble concentrating - - 1 0 0  Moving slowly or fidgety/restless - - 0 0 0  Suicidal thoughts - - 0 0 0  PHQ-9 Score - - 4 0 0  Difficult doing work/chores - - - - Not difficult at all  Some recent data might be hidden    phq 9 is negative  Fall Risk: Fall Risk  04/01/2020 01/30/2020 01/30/2020 07/30/2019 04/18/2019  Falls in the past year? 0 0 0 0 0  Number falls in past yr: 0 0 0 0 0  Injury with Fall? 0 0 0 0 0  Follow up - - - Falls evaluation completed -    Functional Status Survey: Is the patient deaf or have difficulty hearing?: No Does the patient have difficulty seeing, even when wearing glasses/contacts?: No Does the patient have difficulty concentrating, remembering, or making decisions?: No Does the patient have difficulty walking or climbing stairs?: No Does the patient have difficulty dressing or bathing?: No Does the patient have difficulty doing errands alone such as visiting a doctor's office or shopping?: No    Assessment & Plan  1. Dysuria  - POCT Urinalysis Dip Manual - Urine Culture - ciprofloxacin (CIPRO) 250 MG tablet; Take 1 tablet (250 mg total) by mouth 2 (two) times daily.  Dispense: 14 tablet; Refill: 0

## 2020-04-02 LAB — URINE CULTURE
MICRO NUMBER:: 11150060
SPECIMEN QUALITY:: ADEQUATE

## 2020-04-28 ENCOUNTER — Other Ambulatory Visit: Payer: Self-pay | Admitting: Family Medicine

## 2020-04-28 DIAGNOSIS — Z3041 Encounter for surveillance of contraceptive pills: Secondary | ICD-10-CM

## 2020-04-28 NOTE — Telephone Encounter (Signed)
Requested Prescriptions  Pending Prescriptions Disp Refills  . NECON 0.5/35, 28, 0.5-35 MG-MCG tablet [Pharmacy Med Name: NECON (28) TAB 0.5/35] 84 tablet 0    Sig: TAKE 1 TABLET DAILY     OB/GYN:  Contraceptives Failed - 04/28/2020  8:10 AM      Failed - Last BP in normal range    BP Readings from Last 1 Encounters:  04/01/20 140/90         Passed - Valid encounter within last 12 months    Recent Outpatient Visits          3 weeks ago [facility] Steele Sizer, MD   2 months ago Uterine leiomyoma, unspecified location   [facility] Steele Sizer, MD   9 months ago Morbid obesity [facility])   [facility] Steele Sizer, MD   1 year ago Morbid obesity [facility])   [facility] Steele Sizer, MD   1 year ago Well woman exam   [facility] Steele Sizer, MD      Future Appointments            In 3 months Ancil Boozer, Drue Stager, MD [facility], [place]   In 5 months Steele Sizer, MD [facility], [facility]

## 2020-05-26 ENCOUNTER — Telehealth: Payer: Self-pay

## 2020-05-26 NOTE — Telephone Encounter (Signed)
Copied from CRM [id number]. Topic: General - Other >> May 26, 2020  2:27 PM Jaquita Rector A wrote: Reason for CRM: Patient called in to ask Dr Carlynn Purl nurse to please call her to discuss getting a medication changed. Asking for a call back when possible stated that last message she left there was no call back. Please advise Ph# [phone]

## 2020-05-29 ENCOUNTER — Other Ambulatory Visit: Payer: Self-pay | Admitting: Family Medicine

## 2020-06-02 ENCOUNTER — Other Ambulatory Visit: Payer: Self-pay | Admitting: Family Medicine

## 2020-06-02 DIAGNOSIS — A6009 Herpesviral infection of other urogenital tract: Secondary | ICD-10-CM

## 2020-06-02 MED ORDER — VALACYCLOVIR HCL 500 MG PO TABS: ORAL_TABLET | ORAL | 3 refills | Status: DC

## 2020-06-09 ENCOUNTER — Encounter: Payer: Self-pay | Admitting: Family Medicine

## 2020-06-20 ENCOUNTER — Other Ambulatory Visit: Payer: Self-pay | Admitting: Family Medicine

## 2020-06-20 DIAGNOSIS — I10 Essential (primary) hypertension: Secondary | ICD-10-CM

## 2020-07-14 ENCOUNTER — Other Ambulatory Visit: Payer: Self-pay | Admitting: Family Medicine

## 2020-07-14 DIAGNOSIS — Z3041 Encounter for surveillance of contraceptive pills: Secondary | ICD-10-CM

## 2020-07-29 ENCOUNTER — Ambulatory Visit: Payer: BC Managed Care – PPO | Admitting: Family Medicine

## 2020-07-30 ENCOUNTER — Ambulatory Visit: Payer: BC Managed Care – PPO | Admitting: Family Medicine

## 2020-08-06 ENCOUNTER — Encounter: Payer: Self-pay | Admitting: Family Medicine

## 2020-08-11 ENCOUNTER — Other Ambulatory Visit: Payer: Self-pay | Admitting: Family Medicine

## 2020-08-11 DIAGNOSIS — Z3041 Encounter for surveillance of contraceptive pills: Secondary | ICD-10-CM

## 2020-08-11 MED ORDER — NECON 0.5/35 (28) 0.5-35 MG-MCG PO TABS: 1.0000 | ORAL_TABLET | Freq: Every day | ORAL | 0 refills | Status: DC

## 2020-09-09 NOTE — Progress Notes (Signed)
Name: Kelsey Barnes   MRN: [id number]    DOB: [birth year]   Date:09/10/2020       Progress Note  Subjective  Chief Complaint  Follow Up  HPI  HTN:  Triamterene/hctz and Exforge 5/160 mg , she has noticed some tachycardia lately, and bp towards high end of normal, she used to take Atenolol many years ago and we will resume medication if bp remains elevated we will adjust dose of Exforge .  No chest pain, dizziness or  SOB.   AR: she has been off nasal sprays - she does not like it, using otc  Zyrtec. She has noticed increase in rhinorrhea and sneezing, but no congestion.   Herpes : no outbreaks as long as she takes medication.Doing well   Metabolic syndrome/obesity: A1C has been normal. She lost 8 lbs since last fall. She is now on a carbohydrate restrictive diet, still not exercising but will try soon   Dyslipidemia:LDL improved improved, we will recheck labs today. Not on medications at this time  Fibroid tumors: seeing Dr. Gilman Schmidt she has recurrence of fibroids, cycles are heavy at times. She is thinking about surgery but is afraid.   Morbid obesity: BMI above 35 with co-morbidities. She has metatolic syndrome, HTN and dyslipidemia. She is losing weight and eating healthy     Patient Active Problem List   Diagnosis Date Noted  . Morbid obesity ([place]) 07/30/2019  . Thrombocytosis 07/30/2019  . Need for hepatitis B vaccination 12/27/2017  . Vitamin D deficiency 12/13/2014  . Perennial allergic rhinitis 12/13/2014  . Metabolic syndrome 81/85/6314  . Herpes genitalis in women 12/13/2014  . Goiter 12/13/2014  . Dyslipidemia 12/13/2014  . Hematuria, microscopic 12/13/2014  . Hypertension 09/06/2013  . Obesity (BMI 30-39.9) 09/06/2013  . H/O myomectomy 05/14/2013    Past Surgical History:  Procedure Laterality Date  . APPENDECTOMY  2008  . CHOLECYSTECTOMY  2016  . MYOMECTOMY  12.19.2014  . TONSILLECTOMY AND ADENOIDECTOMY N/A 10/15/2015   Procedure: TONSILLECTOMY  ;  Surgeon: Carloyn Manner, MD;  Location: [place];  Service: ENT;  Laterality: N/A;  UPREG    Family History  Problem Relation Age of Onset  . Hypertension Mother   . Diabetes Mother   . COPD Mother   . Stroke Mother   . Hypertension Father   . Heart attack Father   . Hypertension Sister   . Heart disease Maternal Grandmother   . Diabetes Maternal Grandmother   . Hypertension Maternal Grandmother   . Cancer Maternal Grandfather     Social History   Tobacco Use  . Smoking status: Never Smoker  . Smokeless tobacco: Never Used  Substance Use Topics  . Alcohol use: Yes    Alcohol/week: 0.0 standard drinks    Comment: occasionally     Current Outpatient Medications:  .  atenolol (TENORMIN) 25 MG tablet, Take 1 tablet (25 mg total) by mouth daily., Disp: 90 tablet, Rfl: 1 .  cetirizine (ZYRTEC) 10 MG tablet, Take 10 mg by mouth 2 (two) times daily. Am and pm, Disp: , Rfl:  .  norethindrone-ethinyl estradiol (NECON 0.5/35, 28,) 0.5-35 MG-MCG tablet, Take 1 tablet by mouth daily., Disp: 84 tablet, Rfl: 3 .  triamterene-hydrochlorothiazide (DYAZIDE) 37.5-25 MG capsule, Take 1 each (1 capsule total) by mouth daily., Disp: 90 capsule, Rfl: 1 .  valACYclovir (VALTREX) 500 MG tablet, tid, Disp: 100 tablet, Rfl: 3  Allergies  Allergen Reactions  . Ace Inhibitors     Per ENT ,  cough   . Percocet [Oxycodone-Acetaminophen] Itching and Other (See Comments)    Hot Flashes    I personally reviewed active problem list, medication list, allergies, family history, social history, health maintenance with the patient/caregiver today.   ROS  Constitutional: Negative for fever or weight change.  Respiratory: Negative for cough and shortness of breath.   Cardiovascular: Negative for chest pain or palpitations.  Gastrointestinal: Negative for abdominal pain, no bowel changes.  Musculoskeletal: Negative for gait problem or joint swelling.  Skin: Negative for rash.   Neurological: Negative for dizziness or headache.  No other specific complaints in a complete review of systems (except as listed in HPI above).  Objective  Vitals:   09/10/20 0746  BP: 138/86  Pulse: 92  Resp: 16  Temp: 97.8 F (36.6 C)  TempSrc: Oral  SpO2: 99%  Weight: 258 lb (117 kg)  Height: 5\' 8"  (1.727 m)    Body mass index is 39.23 kg/m.  Physical Exam  Constitutional: Patient appears well-developed and well-nourished. Obese  No distress.  HEENT: head atraumatic, normocephalic, pupils equal and reactive to light,  neck supple Cardiovascular: Normal rate, regular rhythm and normal heart sounds.  No murmur heard. No BLE edema. Pulmonary/Chest: Effort normal and breath sounds normal. No respiratory distress. Abdominal: Soft.  There is no tenderness. Psychiatric: Patient has a normal mood and affect. behavior is normal. Judgment and thought content normal.  PHQ2/9: Depression screen [facility] 2/9 09/10/2020 04/01/2020 01/30/2020 07/30/2019 04/18/2019  Decreased Interest 0 0 0 1 0  Down, Depressed, Hopeless 0 0 0 0 0  PHQ - 2 Score 0 0 0 1 0  Altered sleeping - - - 1 0  Tired, decreased energy - - - 1 0  Change in appetite - - - 0 0  Feeling bad or failure about yourself  - - - 0 0  Trouble concentrating - - - 1 0  Moving slowly or fidgety/restless - - - 0 0  Suicidal thoughts - - - 0 0  PHQ-9 Score - - - 4 0  Difficult doing work/chores - - - - -  Some recent data might be hidden    phq 9 is negative   Fall Risk: Fall Risk  09/10/2020 04/01/2020 01/30/2020 01/30/2020 07/30/2019  Falls in the past year? 0 0 0 0 0  Number falls in past yr: 0 0 0 0 0  Injury with Fall? 0 0 0 0 0  Follow up - - - - Falls evaluation completed      Functional Status Survey: Is the patient deaf or have difficulty hearing?: No Does the patient have difficulty seeing, even when wearing glasses/contacts?: No Does the patient have difficulty concentrating, remembering, or making decisions?:  Yes Does the patient have difficulty walking or climbing stairs?: No Does the patient have difficulty dressing or bathing?: No Does the patient have difficulty doing errands alone such as visiting a doctor's office or shopping?: No    Assessment & Plan  1. Essential hypertension  - CBC with Differential/Platelet - COMPLETE METABOLIC PANEL WITH GFR - Microalbumin / creatinine urine ratio - triamterene-hydrochlorothiazide (DYAZIDE) 37.5-25 MG capsule; Take 1 each (1 capsule total) by mouth daily.  Dispense: 90 capsule; Refill: 1 - atenolol (TENORMIN) 25 MG tablet; Take 1 tablet (25 mg total) by mouth daily.  Dispense: 90 tablet; Refill: 1  2. Morbid obesity ([place])  Discussed with the patient the risk posed by an increased BMI. Discussed importance of portion control, calorie counting and at  least 150 minutes of physical activity weekly. Avoid sweet beverages and drink more water. Eat at least 6 servings of fruit and vegetables daily   3. Dyslipidemia  - Lipid panel  4. Vitamin D deficiency  - VITAMIN D 25 Hydroxy (Vit-D Deficiency, Fractures)  5. Metabolic syndrome  - Hemoglobin A1c  6. Perennial allergic rhinitis   7. Encounter for surveillance of contraceptive pills  - norethindrone-ethinyl estradiol (NECON 0.5/35, 28,) 0.5-35 MG-MCG tablet; Take 1 tablet by mouth daily.  Dispense: 84 tablet; Refill: 3  8. Tachycardia  We will resume Atenolol   9. Herpes genitalis in women  - valACYclovir (VALTREX) 500 MG tablet; tid  Dispense: 100 tablet; Refill: 3

## 2020-09-10 ENCOUNTER — Encounter: Payer: Self-pay | Admitting: Family Medicine

## 2020-09-10 ENCOUNTER — Ambulatory Visit (INDEPENDENT_AMBULATORY_CARE_PROVIDER_SITE_OTHER): Payer: BC Managed Care – PPO | Admitting: Family Medicine

## 2020-09-10 ENCOUNTER — Other Ambulatory Visit: Payer: Self-pay

## 2020-09-10 VITALS — BP 138/86 | HR 92 | Temp 97.8°F | Resp 16 | Ht 68.0 in | Wt 258.0 lb

## 2020-09-10 DIAGNOSIS — E8881 Metabolic syndrome: Secondary | ICD-10-CM

## 2020-09-10 DIAGNOSIS — E785 Hyperlipidemia, unspecified: Secondary | ICD-10-CM | POA: Diagnosis not present

## 2020-09-10 DIAGNOSIS — R000 Tachycardia, unspecified: Secondary | ICD-10-CM

## 2020-09-10 DIAGNOSIS — I10 Essential (primary) hypertension: Secondary | ICD-10-CM | POA: Diagnosis not present

## 2020-09-10 DIAGNOSIS — Z3041 Encounter for surveillance of contraceptive pills: Secondary | ICD-10-CM

## 2020-09-10 DIAGNOSIS — J3089 Other allergic rhinitis: Secondary | ICD-10-CM

## 2020-09-10 DIAGNOSIS — A6009 Herpesviral infection of other urogenital tract: Secondary | ICD-10-CM

## 2020-09-10 DIAGNOSIS — E559 Vitamin D deficiency, unspecified: Secondary | ICD-10-CM

## 2020-09-10 MED ORDER — TRIAMTERENE-HCTZ 37.5-25 MG PO CAPS: 1.0000 | ORAL_CAPSULE | Freq: Every day | ORAL | 1 refills | Status: DC

## 2020-09-10 MED ORDER — NECON 0.5/35 (28) 0.5-35 MG-MCG PO TABS: 1.0000 | ORAL_TABLET | Freq: Every day | ORAL | 0 refills | Status: DC

## 2020-09-10 MED ORDER — VALACYCLOVIR HCL 500 MG PO TABS: ORAL_TABLET | ORAL | 3 refills | Status: DC

## 2020-09-10 MED ORDER — ATENOLOL 25 MG PO TABS: 25.0000 mg | ORAL_TABLET | Freq: Every day | ORAL | 1 refills | Status: DC

## 2020-09-10 MED ORDER — NECON 0.5/35 (28) 0.5-35 MG-MCG PO TABS: 1.0000 | ORAL_TABLET | Freq: Every day | ORAL | 3 refills | Status: DC

## 2020-09-11 LAB — COMPLETE METABOLIC PANEL WITH GFR
AG Ratio: 1.3 (calc) (ref 1.0–2.5)
ALT: 57 U/L — ABNORMAL HIGH (ref 6–29)
AST: 37 U/L — ABNORMAL HIGH (ref 10–30)
Albumin: 4.6 g/dL (ref 3.6–5.1)
Alkaline phosphatase (APISO): 52 U/L (ref 31–125)
BUN: 10 mg/dL (ref 7–25)
CO2: 27 mmol/L (ref 20–32)
Calcium: 9.7 mg/dL (ref 8.6–10.2)
Chloride: 100 mmol/L (ref 98–110)
Creat: 0.74 mg/dL (ref 0.50–1.10)
GFR, Est African American: 119 mL/min/{1.73_m2} (ref >=60)
GFR, Est Non African American: 103 mL/min/{1.73_m2} (ref >=60)
Globulin: 3.6 g/dL (calc) (ref 1.9–3.7)
Glucose, Bld: 89 mg/dL (ref 65–99)
Potassium: 3.6 mmol/L (ref 3.5–5.3)
Sodium: 138 mmol/L (ref 135–146)
Total Bilirubin: 0.5 mg/dL (ref 0.2–1.2)
Total Protein: 8.2 g/dL — ABNORMAL HIGH (ref 6.1–8.1)

## 2020-09-11 LAB — HEMOGLOBIN A1C
Hgb A1c MFr Bld: 4.8 % of total Hgb (ref <5.7)
Mean Plasma Glucose: 91 mg/dL
eAG (mmol/L): 5 mmol/L

## 2020-09-11 LAB — CBC WITH DIFFERENTIAL/PLATELET
Absolute Monocytes: 301 cells/uL (ref 200–950)
Basophils Absolute: 59 cells/uL (ref 0–200)
Basophils Relative: 1 %
Eosinophils Absolute: 71 cells/uL (ref 15–500)
Eosinophils Relative: 1.2 %
HCT: 40.5 % (ref 35.0–45.0)
Hemoglobin: 13.6 g/dL (ref 11.7–15.5)
Lymphs Abs: 2095 cells/uL (ref 850–3900)
MCH: 28.8 pg (ref 27.0–33.0)
MCHC: 33.6 g/dL (ref 32.0–36.0)
MCV: 85.6 fL (ref 80.0–100.0)
MPV: 10.6 fL (ref 7.5–12.5)
Monocytes Relative: 5.1 %
Neutro Abs: 3375 cells/uL (ref 1500–7800)
Neutrophils Relative %: 57.2 %
Platelets: 348 10*3/uL (ref 140–400)
RBC: 4.73 10*6/uL (ref 3.80–5.10)
RDW: 12.8 % (ref 11.0–15.0)
Total Lymphocyte: 35.5 %
WBC: 5.9 10*3/uL (ref 3.8–10.8)

## 2020-09-11 LAB — LIPID PANEL
Cholesterol: 218 mg/dL — ABNORMAL HIGH (ref <200)
HDL: 55 mg/dL (ref >=50)
LDL Cholesterol (Calc): 136 mg/dL (calc) — ABNORMAL HIGH
Non-HDL Cholesterol (Calc): 163 mg/dL (calc) — ABNORMAL HIGH (ref <130)
Total CHOL/HDL Ratio: 4 (calc) (ref <5.0)
Triglycerides: 138 mg/dL (ref <150)

## 2020-09-11 LAB — MICROALBUMIN / CREATININE URINE RATIO
Creatinine, Urine: 145 mg/dL (ref 20–275)
Microalb Creat Ratio: 17 mcg/mg creat (ref <30)
Microalb, Ur: 2.5 mg/dL

## 2020-09-11 LAB — VITAMIN D 25 HYDROXY (VIT D DEFICIENCY, FRACTURES): Vit D, 25-Hydroxy: 37 ng/mL (ref 30–100)

## 2020-10-08 ENCOUNTER — Encounter: Payer: BC Managed Care – PPO | Admitting: Family Medicine

## 2020-12-11 ENCOUNTER — Encounter: Payer: Self-pay | Admitting: Family Medicine

## 2020-12-11 NOTE — Telephone Encounter (Signed)
Medication Refill - Medication: Amlodpine-Valsartan  Has the patient contacted their pharmacy? Yes.   Pt stating that pharmacy has no refills on medication and states that she only has 4 days left. Please advise.   This medication is not on pts medicatiom list, but this is what she specifically asked for.  (Agent: If no, request that the patient contact the pharmacy for the refill.) (Agent: If yes, when and what did the pharmacy advise?)  Preferred Pharmacy (with phone number or street name):  [facility] [address]  [place] [address]  Phone: [phone] Fax: [phone]  Hours: Not open 24 hours    Agent: Please be advised that RX refills may take up to 3 business days. We ask that you follow-up with your pharmacy.

## 2020-12-11 NOTE — Telephone Encounter (Signed)
TC to inform Kelsey Barnes that the requested medication Amlodine-Valsartan was discontinued at her last OV on 09/10/20. Refusing this refill request as it is not appropriate to refill. No other needs per patient.

## 2020-12-11 NOTE — Telephone Encounter (Signed)
This encounter was created in error - please disregard.

## 2021-03-03 ENCOUNTER — Other Ambulatory Visit: Payer: Self-pay

## 2021-03-03 ENCOUNTER — Encounter: Payer: Self-pay | Admitting: Family Medicine

## 2021-03-03 ENCOUNTER — Ambulatory Visit (INDEPENDENT_AMBULATORY_CARE_PROVIDER_SITE_OTHER): Payer: BC Managed Care – PPO | Admitting: Family Medicine

## 2021-03-03 DIAGNOSIS — I10 Essential (primary) hypertension: Secondary | ICD-10-CM | POA: Diagnosis not present

## 2021-03-03 DIAGNOSIS — Z0000 Encounter for general adult medical examination without abnormal findings: Secondary | ICD-10-CM

## 2021-03-03 DIAGNOSIS — E8881 Metabolic syndrome: Secondary | ICD-10-CM

## 2021-03-03 DIAGNOSIS — D259 Leiomyoma of uterus, unspecified: Secondary | ICD-10-CM

## 2021-03-03 DIAGNOSIS — R42 Dizziness and giddiness: Secondary | ICD-10-CM

## 2021-03-03 DIAGNOSIS — N920 Excessive and frequent menstruation with regular cycle: Secondary | ICD-10-CM | POA: Diagnosis not present

## 2021-03-03 MED ORDER — ATENOLOL 25 MG PO TABS: 25.0000 mg | ORAL_TABLET | Freq: Every day | ORAL | 1 refills | Status: DC

## 2021-03-03 MED ORDER — OZEMPIC (0.25 OR 0.5 MG/DOSE) 2 MG/1.5ML ~~LOC~~ SOPN: 0.5000 mg | PEN_INJECTOR | SUBCUTANEOUS | 0 refills | Status: DC

## 2021-03-03 MED ORDER — TRIAMTERENE-HCTZ 37.5-25 MG PO CAPS: 1.0000 | ORAL_CAPSULE | Freq: Every day | ORAL | 1 refills | Status: DC

## 2021-03-03 NOTE — Progress Notes (Signed)
Name: Kelsey Barnes   MRN: [id number]    DOB: 05-Jul-1980   Date:03/03/2021       Progress Note  Subjective  Chief Complaint  Annual Exam  HPI  Patient presents for annual CPE.  Obesity: BMI improved but still above 35 with co-morbidities such as HTN, metabolic syndrome, she has lost weight since last visit, she states she had lost more weight but since June she has not been as compliant  with her diet  Dizziness: she states when she stands up she has a throbbing pulsating sensation  on her head, but denies dizziness. , it goes away by itself, she states when she checks bp it is usually 130/70's-80's . She states it is sporadic and not sure of pattern, discussed keeping a diary, try to stay hydrated and monitor   Metabolic syndrome: she has episode of feeling thirsty and gets shaky when she skips meals, denies polyuria or polyphagia. She asked about weight loss medications. She denies family history of thyroid cancer or personal history of pancreatitis   HTN: compliant with medication, no chest pain or palpitation, bp at home around 130/80's  Diet: she was on keto and intermittent fasting, however not as compliant since June, but will resume it  Exercise: discussed importance of 150 minutes per week    [place] Visit from 03/03/2021 in [facility]  AUDIT-C Score 2      Depression: Phq 9 is  negative Depression screen [facility] 2/9 03/03/2021 09/10/2020 04/01/2020 01/30/2020 07/30/2019  Decreased Interest 0 0 0 0 1  Down, Depressed, Hopeless 0 0 0 0 0  PHQ - 2 Score 0 0 0 0 1  Altered sleeping - - - - 1  Tired, decreased energy - - - - 1  Change in appetite - - - - 0  Feeling bad or failure about yourself  - - - - 0  Trouble concentrating - - - - 1  Moving slowly or fidgety/restless - - - - 0  Suicidal thoughts - - - - 0  PHQ-9 Score - - - - 4  Difficult doing work/chores - - - - -  Some recent data might be hidden    Orthostatic VS for the past 72 hrs  (Last 3 readings):  Orthostatic BP Patient Position Orthostatic Pulse  03/03/21 1608 138/84 Standing 71  03/03/21 1607 132/82 Sitting 62  03/03/21 1606 136/82 Supine 63     Hypertension: BP Readings from Last 3 Encounters:  03/03/21 128/78  09/10/20 138/86  04/01/20 140/90   Obesity: Wt Readings from Last 3 Encounters:  03/03/21 247 lb (112 kg)  09/10/20 258 lb (117 kg)  04/01/20 265 lb 14.4 oz (120.6 kg)   BMI Readings from Last 3 Encounters:  03/03/21 37.56 kg/m  09/10/20 39.23 kg/m  04/01/20 40.43 kg/m     Vaccines:   Pneumonia: educated and discussed with patient. Flu: she will get it at work   Hep C Screening: N/A STD testing and prevention (HIV/chl/gon/syphilis): 04/18/15 Intimate partner violence: negative Sexual History :not sexually active  Menstrual History/LMP/Abnormal Bleeding: she has very heavy cycles, changes ultra tampon every 2 hours, also uses a pad also, seen by Dr. Su Grand April 2021 , she is going back to discuss myomectomy  Incontinence Symptoms: no problems at this time   Breast cancer:  - Last Mammogram: N/A - BRCA gene screening: N/A  Osteoporosis: Discussed high calcium and vitamin D supplementation, weight bearing exercises  Cervical cancer screening: 2018, not  sexually active, repeat in 2023 - going to see gyn for it   Skin cancer: Discussed monitoring for atypical lesions  Colorectal cancer: N/A   Lung cancer: Low Dose CT Chest recommended if Age 11-80 years, 20 pack-year currently smoking OR have quit w/in 15years. Patient does not qualify.   ECG: 01/20/14  Advanced Care Planning: A voluntary discussion about advance care planning including the explanation and discussion of advance directives.  Discussed health care proxy and Living will, and the patient was able to identify a health care proxy as sister   Lipids: Lab Results  Component Value Date   CHOL 218 (H) 09/10/2020   CHOL 194 07/30/2019   CHOL 223 (H) 10/18/2018   Lab  Results  Component Value Date   HDL 55 09/10/2020   HDL 57 07/30/2019   HDL 57 10/18/2018   Lab Results  Component Value Date   LDLCALC 136 (H) 09/10/2020   LDLCALC 110 (H) 07/30/2019   LDLCALC 142 (H) 10/18/2018   Lab Results  Component Value Date   TRIG 138 09/10/2020   TRIG 154 (H) 07/30/2019   TRIG 122 10/18/2018   Lab Results  Component Value Date   CHOLHDL 4.0 09/10/2020   CHOLHDL 3.4 07/30/2019   CHOLHDL 3.9 10/18/2018   No results found for: LDLDIRECT  Glucose: Glucose  Date Value Ref Range Status  02/08/2014 87 65 - 99 mg/dL Final  01/29/2014 78 65 - 99 mg/dL Final  01/20/2014 77 65 - 99 mg/dL Final   Glucose, Bld  Date Value Ref Range Status  09/10/2020 89 65 - 99 mg/dL Final    Comment:    .            Fasting reference interval .   07/30/2019 70 65 - 99 mg/dL Final    Comment:    .            Fasting reference interval .   10/18/2018 68 65 - 99 mg/dL Final    Comment:    .            Fasting reference interval .     Patient Active Problem List   Diagnosis Date Noted   Menorrhagia with regular cycle 03/03/2021   Uterine leiomyoma 03/03/2021   Morbid obesity ([place]) 07/30/2019   Vitamin D deficiency 12/13/2014   Perennial allergic rhinitis 03/27/2535   Metabolic syndrome 64/40/3474   Herpes genitalis in women 12/13/2014   Goiter 12/13/2014   Dyslipidemia 12/13/2014   Hematuria, microscopic 12/13/2014   Hypertension 09/06/2013   Obesity (BMI 30-39.9) 09/06/2013   H/O myomectomy 05/14/2013    Past Surgical History:  Procedure Laterality Date   APPENDECTOMY  2008   CHOLECYSTECTOMY  2016   MYOMECTOMY  12.19.2014   TONSILLECTOMY AND ADENOIDECTOMY N/A 10/15/2015   Procedure: TONSILLECTOMY ;  Surgeon: Carloyn Manner, MD;  Location: [place];  Service: ENT;  Laterality: N/A;  UPREG    Family History  Problem Relation Age of Onset   Hypertension Mother    Diabetes Mother    COPD Mother    Stroke Mother    Hypertension  Father    Heart attack Father    Hypertension Sister    Heart disease Maternal Grandmother    Diabetes Maternal Grandmother    Hypertension Maternal Grandmother    Cancer Maternal Grandfather     Social History   Socioeconomic History   Marital status: Single    Spouse name: Not on file   Number of  children: 0   Years of education: Not on file   Highest education level: Associate degree: academic program  Occupational History   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  Tobacco Use   Smoking status: Never   Smokeless tobacco: Never  Vaping Use   Vaping Use: Never used  Substance and Sexual Activity   Alcohol use: Yes    Alcohol/week: 0.0 standard drinks    Comment: occasionally   Drug use: No   Sexual activity: Not Currently    Birth control/protection: Pill  Other Topics Concern   Not on file  Social History Narrative   She works full time at [employer]    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 at [employer], graduated 2022     Social Determinants of Health   Financial Resource Strain: Low Risk    Difficulty of Paying Living Expenses: Not hard at all  Food Insecurity: No Food Insecurity   Worried About Charity fundraiser in the Last Year: Never true   Arboriculturist in the Last Year: Never true  Transportation Needs: No Transportation Needs   Lack of Transportation (Medical): No   Lack of Transportation (Non-Medical): No  Physical Activity: Inactive   Days of Exercise per Week: 0 days   Minutes of Exercise per Session: 0 min  Stress: No Stress Concern Present   Feeling of Stress : Only a little  Social Connections: Socially Isolated   Frequency of Communication with Friends and Family: More than three times a week   Frequency of Social Gatherings with Friends and Family: Three times a week   Attends Religious Services: Never   Active Member of Clubs or Organizations: No   Attends Archivist Meetings: Never   Marital Status:  Never married  Human resources officer Violence: Not At Risk   Fear of Current or Ex-Partner: No   Emotionally Abused: No   Physically Abused: No   Sexually Abused: No     Current Outpatient Medications:    cetirizine (ZYRTEC) 10 MG tablet, Take 10 mg by mouth 2 (two) times daily. Am and pm, Disp: , Rfl:    norethindrone-ethinyl estradiol (NECON 0.5/35, 28,) 0.5-35 MG-MCG tablet, Take 1 tablet by mouth daily., Disp: 84 tablet, Rfl: 3   Semaglutide,0.25 or 0.5MG /DOS, (OZEMPIC, 0.25 OR 0.5 MG/DOSE,) 2 MG/1.5ML SOPN, Inject 0.5 mg into the skin once a week., Disp: 4.5 mL, Rfl: 0   valACYclovir (VALTREX) 500 MG tablet, tid, Disp: 100 tablet, Rfl: 3   atenolol (TENORMIN) 25 MG tablet, Take 1 tablet (25 mg total) by mouth daily., Disp: 90 tablet, Rfl: 1   triamterene-hydrochlorothiazide (DYAZIDE) 37.5-25 MG capsule, Take 1 each (1 capsule total) by mouth daily., Disp: 90 capsule, Rfl: 1  Allergies  Allergen Reactions   Ace Inhibitors     Per ENT , cough    Percocet [Oxycodone-Acetaminophen] Itching and Other (See Comments)    Hot Flashes     ROS  Constitutional: Negative for fever, positive for  weight change.  Respiratory: Negative for cough and shortness of breath.   Cardiovascular: Negative for chest pain or palpitations.  Gastrointestinal: Negative for abdominal pain, no bowel changes.  Musculoskeletal: Negative for gait problem or joint swelling.  Skin: Negative for rash.  Neurological: Negative for headache.  No other specific complaints in a complete review of systems (except as listed in HPI above).   Objective  Vitals:   03/03/21 1519  BP: 128/78  Pulse: 74  Resp: 16  Temp: 98.1 F (36.7 C)  SpO2: 99%  Weight: 247 lb (112 kg)  Height: 5\' 8"  (1.727 m)    Body mass index is 37.56 kg/m.  Physical Exam  Constitutional: Patient appears well-developed and well-nourished. No distress.  HENT: Head: Normocephalic and atraumatic. Ears: B TMs ok, no erythema or effusion;  Nose: Nose normal. Mouth/Throat: not done  Eyes: Conjunctivae and EOM are normal. Pupils are equal, round, and reactive to light. No scleral icterus.  Neck: Normal range of motion. Neck supple. No JVD present. No thyromegaly present.  Cardiovascular: Normal rate, regular rhythm and normal heart sounds.  No murmur heard. No BLE edema. Pulmonary/Chest: Effort normal and breath sounds normal. No respiratory distress. Abdominal: Soft. Bowel sounds are normal, no distension. There is no tenderness. no masses Breast: no lumps or masses, no nipple discharge or rashes FEMALE GENITALIA:  Not done  RECTAL: not done  Musculoskeletal: Normal range of motion, no joint effusions. No gross deformities Neurological: he is alert and oriented to person, place, and time. No cranial nerve deficit. Coordination, balance, strength, speech and gait are normal.  Skin: Skin is warm and dry. No rash noted. No erythema.  Psychiatric: Patient has a normal mood and affect. behavior is normal. Judgment and thought content normal.   Fall Risk: Fall Risk  03/03/2021 09/10/2020 04/01/2020 01/30/2020 01/30/2020  Falls in the past year? 0 0 0 0 0  Number falls in past yr: 0 0 0 0 0  Injury with Fall? 0 0 0 0 0  Risk for fall due to : No Fall Risks - - - -  Follow up Falls prevention discussed - - - -     Functional Status Survey: Is the patient deaf or have difficulty hearing?: No Does the patient have difficulty seeing, even when wearing glasses/contacts?: No Does the patient have difficulty concentrating, remembering, or making decisions?: No Does the patient have difficulty walking or climbing stairs?: No Does the patient have difficulty dressing or bathing?: No Does the patient have difficulty doing errands alone such as visiting a doctor's office or shopping?: No   Assessment & Plan  1. Well adult exam   2. Essential hypertension  - triamterene-hydrochlorothiazide (DYAZIDE) 37.5-25 MG capsule; Take 1 each (1  capsule total) by mouth daily.  Dispense: 90 capsule; Refill: 1 - atenolol (TENORMIN) 25 MG tablet; Take 1 tablet (25 mg total) by mouth daily.  Dispense: 90 tablet; Refill: 1  3. Morbid obesity ([place])  - Semaglutide,0.25 or 0.5MG /DOS, (OZEMPIC, 0.25 OR 0.5 MG/DOSE,) 2 MG/1.5ML SOPN; Inject 0.5 mg into the skin once a week.  Dispense: 4.5 mL; Refill: 0  4. Uterine leiomyoma, unspecified location   5. Menorrhagia with regular cycle  She will follow up with GYN  6. Metabolic syndrome  - ACZYSAYTKZS,0.10 or 0.5MG /DOS, (OZEMPIC, 0.25 OR 0.5 MG/DOSE,) 2 MG/1.5ML SOPN; Inject 0.5 mg into the skin once a week.  Dispense: 4.5 mL; Refill: 0   7. Dizziness  More like pulsating sensation left side of head when standing up, no vertigo or change in vision or hearing. No symptoms today and normal orthostatic vital signs     -USPSTF grade A and B recommendations reviewed with patient; age-appropriate recommendations, preventive care, screening tests, etc discussed and encouraged; healthy living encouraged; see AVS for patient education given to patient -Discussed importance of 150 minutes of physical activity weekly, eat two servings of fish weekly, eat one serving of tree nuts ( cashews, pistachios, pecans,  almonds.Marland Kitchen) every other day, eat 6 servings of fruit/vegetables daily and drink plenty of water and avoid sweet beverages.

## 2021-03-03 NOTE — Patient Instructions (Signed)
Preventive Care 21-39 Years Old, Female Preventive care refers to lifestyle choices and visits with your health care provider that can promote health and wellness. This includes: A yearly physical exam. This is also called an annual wellness visit. Regular dental and eye exams. Immunizations. Screening for certain conditions. Healthy lifestyle choices, such as: Eating a healthy diet. Getting regular exercise. Not using drugs or products that contain nicotine and tobacco. Limiting alcohol use. What can I expect for my preventive care visit? Physical exam Your health care provider may check your: Height and weight. These may be used to calculate your BMI (body mass index). BMI is a measurement that tells if you are at a healthy weight. Heart rate and blood pressure. Body temperature. Skin for abnormal spots. Counseling Your health care provider may ask you questions about your: Past medical problems. Family's medical history. Alcohol, tobacco, and drug use. Emotional well-being. Home life and relationship well-being. Sexual activity. Diet, exercise, and sleep habits. Work and work environment. Access to firearms. Method of birth control. Menstrual cycle. Pregnancy history. What immunizations do I need? Vaccines are usually given at various ages, according to a schedule. Your health care provider will recommend vaccines for you based on your age, medical history, and lifestyle or other factors, such as travel or where you work. What tests do I need? Blood tests Lipid and cholesterol levels. These may be checked every 5 years starting at age 20. Hepatitis C test. Hepatitis B test. Screening Diabetes screening. This is done by checking your blood sugar (glucose) after you have not eaten for a while (fasting). STD (sexually transmitted disease) testing, if you are at risk. BRCA-related cancer screening. This may be done if you have a family history of breast, ovarian, tubal, or  peritoneal cancers. Pelvic exam and Pap test. This may be done every 3 years starting at age 21. Starting at age 30, this may be done every 5 years if you have a Pap test in combination with an HPV test. Talk with your health care provider about your test results, treatment options, and if necessary, the need for more tests. Follow these instructions at home: Eating and drinking  Eat a healthy diet that includes fresh fruits and vegetables, whole grains, lean protein, and low-fat dairy products. Take vitamin and mineral supplements as recommended by your health care provider. Do not drink alcohol if: Your health care provider tells you not to drink. You are pregnant, may be pregnant, or are planning to become pregnant. If you drink alcohol: Limit how much you have to 0-1 drink a day. Be aware of how much alcohol is in your drink. In the [place] one drink equals one 12 oz bottle of beer (355 mL), one 5 oz glass of wine (148 mL), or one 1 oz glass of hard liquor (44 mL). Lifestyle Take daily care of your teeth and gums. Brush your teeth every morning and night with fluoride toothpaste. Floss one time each day. Stay active. Exercise for at least 30 minutes 5 or more days each week. Do not use any products that contain nicotine or tobacco, such as cigarettes, e-cigarettes, and chewing tobacco. If you need help quitting, ask your health care provider. Do not use drugs. If you are sexually active, practice safe sex. Use a condom or other form of protection to prevent STIs (sexually transmitted infections). If you do not wish to become pregnant, use a form of birth control. If you plan to become pregnant, see your health care provider   for a prepregnancy visit. Find healthy ways to cope with stress, such as: Meditation, yoga, or listening to music. Journaling. Talking to a trusted person. Spending time with friends and family. Safety Always wear your seat belt while driving or riding in a  vehicle. Do not drive: If you have been drinking alcohol. Do not ride with someone who has been drinking. When you are tired or distracted. While texting. Wear a helmet and other protective equipment during sports activities. If you have firearms in your house, make sure you follow all gun safety procedures. Seek help if you have been physically or sexually abused. What's next? Go to your health care provider once a year for an annual wellness visit. Ask your health care provider how often you should have your eyes and teeth checked. Stay up to date on all vaccines. This information is not intended to replace advice given to you by your health care provider. Make sure you discuss any questions you have with your health care provider. Document Revised: 07/25/2020 Document Reviewed: 01/26/2018 Elsevier Patient Education  2022 Elsevier Inc.  

## 2021-04-06 ENCOUNTER — Other Ambulatory Visit: Payer: Self-pay

## 2021-04-06 DIAGNOSIS — I10 Essential (primary) hypertension: Secondary | ICD-10-CM

## 2021-04-06 MED ORDER — TRIAMTERENE-HCTZ 37.5-25 MG PO CAPS: 1.0000 | ORAL_CAPSULE | Freq: Every day | ORAL | 0 refills | Status: DC

## 2021-05-29 NOTE — Progress Notes (Deleted)
Name: Kelsey Barnes   MRN: [id number]    DOB: 1980/12/27   Date:05/29/2021       Progress Note  Subjective  Chief Complaint  Follow Up  HPI  Obesity: BMI improved but still above 35 with co-morbidities such as HTN, metabolic syndrome, she has lost weight since last visit, she states she had lost more weight but since June she has not been as compliant  with her diet  Dizziness: she states when she stands up she has a throbbing pulsating sensation  on her head, but denies dizziness. , it goes away by itself, she states when she checks bp it is usually 130/70's-80's . She states it is sporadic and not sure of pattern, discussed keeping a diary, try to stay hydrated and monitor   Metabolic syndrome: she has episode of feeling thirsty and gets shaky when she skips meals, denies polyuria or polyphagia. She asked about weight loss medications. She denies family history of thyroid cancer or personal history of pancreatitis   HTN: compliant with medication, no chest pain or palpitation, bp at home around 130/80's  Patient Active Problem List   Diagnosis Date Noted   Menorrhagia with regular cycle 03/03/2021   Uterine leiomyoma 03/03/2021   Morbid obesity ([place]) 07/30/2019   Vitamin D deficiency 12/13/2014   Perennial allergic rhinitis 94/17/4081   Metabolic syndrome 44/81/8563   Herpes genitalis in women 12/13/2014   Goiter 12/13/2014   Dyslipidemia 12/13/2014   Hematuria, microscopic 12/13/2014   Hypertension 09/06/2013   Obesity (BMI 30-39.9) 09/06/2013   H/O myomectomy 05/14/2013    Past Surgical History:  Procedure Laterality Date   APPENDECTOMY  2008   CHOLECYSTECTOMY  2016   MYOMECTOMY  12.19.2014   TONSILLECTOMY AND ADENOIDECTOMY N/A 10/15/2015   Procedure: TONSILLECTOMY ;  Surgeon: Carloyn Manner, MD;  Location: [place];  Service: ENT;  Laterality: N/A;  UPREG    Family History  Problem Relation Age of Onset   Hypertension Mother    Diabetes Mother     COPD Mother    Stroke Mother    Hypertension Father    Heart attack Father    Hypertension Sister    Heart disease Maternal Grandmother    Diabetes Maternal Grandmother    Hypertension Maternal Grandmother    Cancer Maternal Grandfather     Social History   Tobacco Use   Smoking status: Never   Smokeless tobacco: Never  Substance Use Topics   Alcohol use: Yes    Alcohol/week: 0.0 standard drinks    Comment: occasionally     Current Outpatient Medications:    atenolol (TENORMIN) 25 MG tablet, Take 1 tablet (25 mg total) by mouth daily., Disp: 90 tablet, Rfl: 1   cetirizine (ZYRTEC) 10 MG tablet, Take 10 mg by mouth 2 (two) times daily. Am and pm, Disp: , Rfl:    norethindrone-ethinyl estradiol (NECON 0.5/35, 28,) 0.5-35 MG-MCG tablet, Take 1 tablet by mouth daily., Disp: 84 tablet, Rfl: 3   Semaglutide,0.25 or 0.5MG /DOS, (OZEMPIC, 0.25 OR 0.5 MG/DOSE,) 2 MG/1.5ML SOPN, Inject 0.5 mg into the skin once a week., Disp: 4.5 mL, Rfl: 0   triamterene-hydrochlorothiazide (DYAZIDE) 37.5-25 MG capsule, Take 1 each (1 capsule total) by mouth daily., Disp: 90 capsule, Rfl: 0   valACYclovir (VALTREX) 500 MG tablet, tid, Disp: 100 tablet, Rfl: 3  Allergies  Allergen Reactions   Ace Inhibitors     Per ENT , cough    Percocet [Oxycodone-Acetaminophen] Itching and Other (See Comments)  Hot Flashes    I personally reviewed active problem list, medication list, allergies, family history, social history, health maintenance with the patient/caregiver today.   ROS  ***  Objective  There were no vitals filed for this visit.  There is no height or weight on file to calculate BMI.  Physical Exam ***  No results found for this or any previous visit (from the past 2160 hour(s)).   PHQ2/9: Depression screen [facility] 2/9 03/03/2021 09/10/2020 04/01/2020 01/30/2020 07/30/2019  Decreased Interest 0 0 0 0 1  Down, Depressed, Hopeless 0 0 0 0 0  PHQ - 2 Score 0 0 0 0 1  Altered sleeping - - - - 1   Tired, decreased energy - - - - 1  Change in appetite - - - - 0  Feeling bad or failure about yourself  - - - - 0  Trouble concentrating - - - - 1  Moving slowly or fidgety/restless - - - - 0  Suicidal thoughts - - - - 0  PHQ-9 Score - - - - 4  Difficult doing work/chores - - - - -  Some recent data might be hidden    phq 9 is {gen pos [id number]}   Fall Risk: Fall Risk  03/03/2021 09/10/2020 04/01/2020 01/30/2020 01/30/2020  Falls in the past year? 0 0 0 0 0  Number falls in past yr: 0 0 0 0 0  Injury with Fall? 0 0 0 0 0  Risk for fall due to : No Fall Risks - - - -  Follow up Falls prevention discussed - - - -      Functional Status Survey:      Assessment & Plan  *** There are no diagnoses linked to this encounter.

## 2021-06-08 ENCOUNTER — Ambulatory Visit: Payer: BC Managed Care – PPO | Admitting: Family Medicine

## 2021-08-03 ENCOUNTER — Encounter: Payer: Self-pay | Admitting: Family Medicine

## 2021-08-06 ENCOUNTER — Ambulatory Visit: Payer: BC Managed Care – PPO | Admitting: Nurse Practitioner

## 2021-08-06 ENCOUNTER — Encounter: Payer: Self-pay | Admitting: Nurse Practitioner

## 2021-08-06 VITALS — BP 128/78 | HR 63 | Temp 98.0°F | Resp 16 | Ht 67.0 in | Wt 232.3 lb

## 2021-08-06 DIAGNOSIS — E559 Vitamin D deficiency, unspecified: Secondary | ICD-10-CM

## 2021-08-06 DIAGNOSIS — E8881 Metabolic syndrome: Secondary | ICD-10-CM

## 2021-08-06 DIAGNOSIS — E049 Nontoxic goiter, unspecified: Secondary | ICD-10-CM

## 2021-08-06 DIAGNOSIS — J3089 Other allergic rhinitis: Secondary | ICD-10-CM

## 2021-08-06 DIAGNOSIS — I10 Essential (primary) hypertension: Secondary | ICD-10-CM

## 2021-08-06 DIAGNOSIS — A6009 Herpesviral infection of other urogenital tract: Secondary | ICD-10-CM | POA: Diagnosis not present

## 2021-08-06 DIAGNOSIS — E785 Hyperlipidemia, unspecified: Secondary | ICD-10-CM

## 2021-08-06 DIAGNOSIS — D259 Leiomyoma of uterus, unspecified: Secondary | ICD-10-CM

## 2021-08-06 DIAGNOSIS — Z3041 Encounter for surveillance of contraceptive pills: Secondary | ICD-10-CM

## 2021-08-06 MED ORDER — NECON 0.5/35 (28) 0.5-35 MG-MCG PO TABS: 1.0000 | ORAL_TABLET | Freq: Every day | ORAL | 3 refills | Status: DC

## 2021-08-06 MED ORDER — VALACYCLOVIR HCL 500 MG PO TABS: ORAL_TABLET | ORAL | 3 refills | Status: DC

## 2021-08-06 MED ORDER — ATENOLOL 25 MG PO TABS: 25.0000 mg | ORAL_TABLET | Freq: Every day | ORAL | 1 refills | Status: DC

## 2021-08-06 MED ORDER — TRIAMTERENE-HCTZ 37.5-25 MG PO CAPS: 1.0000 | ORAL_CAPSULE | Freq: Every day | ORAL | 1 refills | Status: DC

## 2021-08-06 NOTE — Progress Notes (Signed)
? ?BP 128/78   Pulse 63   Temp 98 ?F (36.7 ?C) (Oral)   Resp 16   Ht '5\' 7"'$  (1.702 m)   Wt 232 lb 4.8 oz (105.4 kg)   SpO2 98%   BMI 36.38 kg/m?   ? ?Subjective:  ? ? Patient ID: Kelsey Barnes, female    DOB: 15-Jun-1980, 41 y.o.   MRN: [id number] ? ?HPI: ?Kelsey Barnes is a 41 y.o. female ? ?Chief Complaint  ?Patient presents with  ? Follow-up  ? Hypertension  ? ?HTN:  She says she has not been checking her blood pressure at home. She says she has been feeling good. She denies any chest pain, shortness of breath or headaches.  She is currently taking Dyazide 37.5-25 mg daily and atenolol 25 mg daily.  Her blood pressure is at goal in the office. Will continue current treatment.  ?  ?AR: She takes zyrtec for her allergies. She says she gets it over the counter. She does not have any concerns about this at this time.  ?  ?Herpes : She is doing well, she says she has not had any outbreaks lately. She is currently taking valacyclovir daily.  ?  ?Metabolic syndrome/obesity : She has not started the ozempic yet.  She says she has been nervous. She continues to work on life style modifications.  She has lost some weight.  She was 247 lbs in 03/03/2021 and today she is 232 lbs.   ?  ?Dyslipidemia: Last LDL was 136 on 09/10/2020, will get labs today. She has been working on lifestyle modifications.  ?  ?Fibroid tumors: seeing Dr. Gilman Schmidt she has recurrence of fibroids, cycles are heavy at times. She is thinking about surgery but is afraid. She has not followed up with GYN regarding surgery. She says she is still working up the nerve.  ?  ?Vitamin D deficiency: She is not currently taking vitamin D, her last vitamin d level was 37 on 09/10/2020.  In the past it has been as low as 22.  ? ?Goiter: Last TSH was normal, will recheck labs today. She denies any symptoms, fatigue, weight gain, palpitations or constipation.  ? ?Review of Systems ? ?Constitutional: Negative for fever or weight change.  ?Respiratory:  Negative for cough and shortness of breath.   ?Cardiovascular: Negative for chest pain or palpitations.  ?Gastrointestinal: Negative for abdominal pain, no bowel changes.  ?Musculoskeletal: Negative for gait problem or joint swelling.  ?Skin: Negative for rash.  ?Neurological: Negative for dizziness or headache.  ?No other specific complaints in a complete review of systems (except as listed in HPI above).  ? ?   ?Objective:  ?  ?BP 128/78   Pulse 63   Temp 98 ?F (36.7 ?C) (Oral)   Resp 16   Ht '5\' 7"'$  (1.702 m)   Wt 232 lb 4.8 oz (105.4 kg)   SpO2 98%   BMI 36.38 kg/m?   ?Wt Readings from Last 3 Encounters:  ?08/06/21 232 lb 4.8 oz (105.4 kg)  ?03/03/21 247 lb (112 kg)  ?09/10/20 258 lb (117 kg)  ?  ?Physical Exam ? ?Constitutional: Patient appears well-developed and well-nourished. Obese  No distress.  ?HEENT: head atraumatic, normocephalic, pupils equal and reactive to light, neck supple ?Cardiovascular: Normal rate, regular rhythm and normal heart sounds.  No murmur heard. No BLE edema. ?Pulmonary/Chest: Effort normal and breath sounds normal. No respiratory distress. ?Abdominal: Soft.  There is no tenderness. ?Psychiatric: Patient has a normal mood and  affect. behavior is normal. Judgment and thought content normal.  ? ?Results for orders placed or performed in visit on 09/10/20  ?Lipid panel  ?Result Value Ref Range  ? Cholesterol 218 (H) <200 mg/dL  ? HDL 55 > OR = 50 mg/dL  ? Triglycerides 138 <150 mg/dL  ? LDL Cholesterol (Calc) 136 (H) mg/dL (calc)  ? Total CHOL/HDL Ratio 4.0 <5.0 (calc)  ? Non-HDL Cholesterol (Calc) 163 (H) <130 mg/dL (calc)  ?CBC with Differential/Platelet  ?Result Value Ref Range  ? WBC 5.9 3.8 - 10.8 Thousand/uL  ? RBC 4.73 3.80 - 5.10 Million/uL  ? Hemoglobin 13.6 11.7 - 15.5 g/dL  ? HCT 40.5 35.0 - 45.0 %  ? MCV 85.6 80.0 - 100.0 fL  ? MCH 28.8 27.0 - 33.0 pg  ? MCHC 33.6 32.0 - 36.0 g/dL  ? RDW 12.8 11.0 - 15.0 %  ? Platelets 348 140 - 400 Thousand/uL  ? MPV 10.6 7.5 - 12.5 fL   ? Neutro Abs 3,375 1,500 - 7,800 cells/uL  ? Lymphs Abs 2,095 850 - 3,900 cells/uL  ? Absolute Monocytes 301 200 - 950 cells/uL  ? Eosinophils Absolute 71 15 - 500 cells/uL  ? Basophils Absolute 59 0 - 200 cells/uL  ? Neutrophils Relative % 57.2 %  ? Total Lymphocyte 35.5 %  ? Monocytes Relative 5.1 %  ? Eosinophils Relative 1.2 %  ? Basophils Relative 1.0 %  ?COMPLETE METABOLIC PANEL WITH GFR  ?Result Value Ref Range  ? Glucose, Bld 89 65 - 99 mg/dL  ? BUN 10 7 - 25 mg/dL  ? Creat 0.74 0.50 - 1.10 mg/dL  ? GFR, Est Non African American 103 > OR = 60 mL/min/1.40m  ? GFR, Est African American 119 > OR = 60 mL/min/1.738m ? BUN/Creatinine Ratio NOT APPLICABLE 6 - 22 (calc)  ? Sodium 138 135 - 146 mmol/L  ? Potassium 3.6 3.5 - 5.3 mmol/L  ? Chloride 100 98 - 110 mmol/L  ? CO2 27 20 - 32 mmol/L  ? Calcium 9.7 8.6 - 10.2 mg/dL  ? Total Protein 8.2 (H) 6.1 - 8.1 g/dL  ? Albumin 4.6 3.6 - 5.1 g/dL  ? Globulin 3.6 1.9 - 3.7 g/dL (calc)  ? AG Ratio 1.3 1.0 - 2.5 (calc)  ? Total Bilirubin 0.5 0.2 - 1.2 mg/dL  ? Alkaline phosphatase (APISO) 52 31 - 125 U/L  ? AST 37 (H) 10 - 30 U/L  ? ALT 57 (H) 6 - 29 U/L  ?Hemoglobin A1c  ?Result Value Ref Range  ? Hgb A1c MFr Bld 4.8 <5.7 % of total Hgb  ? Mean Plasma Glucose 91 mg/dL  ? eAG (mmol/L) 5.0 mmol/L  ?VITAMIN D 25 Hydroxy (Vit-D Deficiency, Fractures)  ?Result Value Ref Range  ? Vit D, 25-Hydroxy 37 30 - 100 ng/mL  ?Microalbumin / creatinine urine ratio  ?Result Value Ref Range  ? Creatinine, Urine 145 20 - 275 mg/dL  ? Microalb, Ur 2.5 mg/dL  ? Microalb Creat Ratio 17 <30 mcg/mg creat  ? ?   ?Assessment & Plan:  ? ?1. Essential hypertension ?-continue current treatment ?- CBC with Differential/Platelet ?- COMPLETE METABOLIC PANEL WITH GFR ?- triamterene-hydrochlorothiazide (DYAZIDE) 37.5-25 MG capsule; Take 1 each (1 capsule total) by mouth daily.  Dispense: 90 capsule; Refill: 1 ?- atenolol (TENORMIN) 25 MG tablet; Take 1 tablet (25 mg total) by mouth daily.  Dispense: 90  tablet; Refill: 1 ? ?2. Perennial allergic rhinitis ?-continue current treatment ? ?3. Herpes genitalis  in women ?-continue current treatment ?- valACYclovir (VALTREX) 500 MG tablet; tid  Dispense: 100 tablet; Refill: 3 ? ?4. Metabolic syndrome ?-continue life style modification ?- Hemoglobin A1c ? ?5. Morbid obesity ([place]) ?-continue life style modification ?- CBC with Differential/Platelet ?- COMPLETE METABOLIC PANEL WITH GFR ?- Hemoglobin A1c ? ?6. Dyslipidemia ?-continue life style modification ?- Lipid panel ? ?7. Uterine leiomyoma, unspecified location ?-follow up with GYN ? ?8. Vitamin D deficiency ? ?- VITAMIN D 25 Hydroxy (Vit-D Deficiency, Fractures) ? ?9. Goiter ? ?- TSH ? ?10. Encounter for surveillance of contraceptive pills ? ?- norethindrone-ethinyl estradiol (NECON 0.5/35, 28,) 0.5-35 MG-MCG tablet; Take 1 tablet by mouth daily.  Dispense: 84 tablet; Refill: 3  ? ?Follow up plan: ?Return in about 6 months (around 02/06/2022) for follow up. ? ? ? ? ? ?

## 2021-08-07 LAB — COMPLETE METABOLIC PANEL WITH GFR
AG Ratio: 1.2 (calc) (ref 1.0–2.5)
ALT: 13 U/L (ref 6–29)
AST: 14 U/L (ref 10–30)
Albumin: 3.9 g/dL (ref 3.6–5.1)
Alkaline phosphatase (APISO): 45 U/L (ref 31–125)
BUN: 11 mg/dL (ref 7–25)
CO2: 28 mmol/L (ref 20–32)
Calcium: 9.6 mg/dL (ref 8.6–10.2)
Chloride: 104 mmol/L (ref 98–110)
Creat: 0.78 mg/dL (ref 0.50–0.97)
Globulin: 3.3 g/dL (calc) (ref 1.9–3.7)
Glucose, Bld: 86 mg/dL (ref 65–99)
Potassium: 4.2 mmol/L (ref 3.5–5.3)
Sodium: 141 mmol/L (ref 135–146)
Total Bilirubin: 0.6 mg/dL (ref 0.2–1.2)
Total Protein: 7.2 g/dL (ref 6.1–8.1)
eGFR: 99 mL/min/{1.73_m2} (ref >=60)

## 2021-08-07 LAB — CBC WITH DIFFERENTIAL/PLATELET
Absolute Monocytes: 526 cells/uL (ref 200–950)
Basophils Absolute: 50 cells/uL (ref 0–200)
Basophils Relative: 0.7 %
Eosinophils Absolute: 58 cells/uL (ref 15–500)
Eosinophils Relative: 0.8 %
HCT: 38.4 % (ref 35.0–45.0)
Hemoglobin: 12.7 g/dL (ref 11.7–15.5)
Lymphs Abs: 2398 cells/uL (ref 850–3900)
MCH: 29.3 pg (ref 27.0–33.0)
MCHC: 33.1 g/dL (ref 32.0–36.0)
MCV: 88.5 fL (ref 80.0–100.0)
MPV: 11.5 fL (ref 7.5–12.5)
Monocytes Relative: 7.3 %
Neutro Abs: 4169 cells/uL (ref 1500–7800)
Neutrophils Relative %: 57.9 %
Platelets: 353 10*3/uL (ref 140–400)
RBC: 4.34 10*6/uL (ref 3.80–5.10)
RDW: 11.8 % (ref 11.0–15.0)
Total Lymphocyte: 33.3 %
WBC: 7.2 10*3/uL (ref 3.8–10.8)

## 2021-08-07 LAB — HEMOGLOBIN A1C
Hgb A1c MFr Bld: 4.8 % of total Hgb (ref <5.7)
Mean Plasma Glucose: 91 mg/dL
eAG (mmol/L): 5 mmol/L

## 2021-08-07 LAB — LIPID PANEL
Cholesterol: 190 mg/dL (ref <200)
HDL: 56 mg/dL (ref >=50)
LDL Cholesterol (Calc): 112 mg/dL (calc) — ABNORMAL HIGH
Non-HDL Cholesterol (Calc): 134 mg/dL (calc) — ABNORMAL HIGH (ref <130)
Total CHOL/HDL Ratio: 3.4 (calc) (ref <5.0)
Triglycerides: 117 mg/dL (ref <150)

## 2021-08-07 LAB — TSH: TSH: 0.79 mIU/L

## 2021-08-07 LAB — VITAMIN D 25 HYDROXY (VIT D DEFICIENCY, FRACTURES): Vit D, 25-Hydroxy: 41 ng/mL (ref 30–100)

## 2021-12-17 ENCOUNTER — Encounter: Payer: Self-pay | Admitting: Family Medicine

## 2022-01-04 ENCOUNTER — Other Ambulatory Visit: Payer: Self-pay | Admitting: Nurse Practitioner

## 2022-01-04 DIAGNOSIS — I10 Essential (primary) hypertension: Secondary | ICD-10-CM

## 2022-01-05 NOTE — Telephone Encounter (Signed)
Patient will need to schedule a follow up appointment for further refills. Requested Prescriptions  Pending Prescriptions Disp Refills  . atenolol (TENORMIN) 25 MG tablet [Pharmacy Med Name: ATENOLOL TAB '25MG'$ ] 90 tablet 1    Sig: TAKE 1 TABLET DAILY     Cardiovascular: Beta Blockers 2 Passed - 01/04/2022 10:31 PM      Passed - Cr in normal range and within 360 days    Creat  Date Value Ref Range Status  08/06/2021 0.78 0.50 - 0.97 mg/dL Final   Creatinine, Urine  Date Value Ref Range Status  09/10/2020 145 20 - 275 mg/dL Final         Passed - Last BP in normal range    BP Readings from Last 1 Encounters:  08/06/21 128/78         Passed - Last Heart Rate in normal range    Pulse Readings from Last 1 Encounters:  08/06/21 63         Passed - Valid encounter within last 6 months    Recent Outpatient Visits          5 months ago Essential hypertension   [facility] Bo Merino, FNP   10 months ago Morbid obesity [facility])   [facility] Steele Sizer, MD   1 year ago Essential hypertension   [facility] Steele Sizer, MD   1 year ago [facility] Steele Sizer, MD   1 year ago Uterine leiomyoma, unspecified location   [facility] Steele Sizer, MD

## 2022-02-02 ENCOUNTER — Encounter: Payer: Self-pay | Admitting: Family Medicine

## 2022-02-13 ENCOUNTER — Other Ambulatory Visit: Payer: Self-pay | Admitting: Nurse Practitioner

## 2022-02-13 DIAGNOSIS — I10 Essential (primary) hypertension: Secondary | ICD-10-CM

## 2022-02-15 NOTE — Telephone Encounter (Signed)
Requested Prescriptions  Pending Prescriptions Disp Refills  . triamterene-hydrochlorothiazide (DYAZIDE) 37.5-25 MG capsule [Pharmacy Med Name: TRIAMT/HCTZ  CAP 37.5-25] 90 capsule 0    Sig: TAKE 1 CAPSULE DAILY     Cardiovascular: Diuretic Combos Failed - 02/13/2022  2:18 AM      Failed - K in normal range and within 180 days    Potassium  Date Value Ref Range Status  08/06/2021 4.2 3.5 - 5.3 mmol/L Final  02/08/2014 3.8 3.5 - 5.1 mmol/L Final         Failed - Na in normal range and within 180 days    Sodium  Date Value Ref Range Status  08/06/2021 141 135 - 146 mmol/L Final  04/22/2017 140 137 - 147 Final  02/08/2014 140 136 - 145 mmol/L Final         Failed - Cr in normal range and within 180 days    Creat  Date Value Ref Range Status  08/06/2021 0.78 0.50 - 0.97 mg/dL Final   Creatinine, Urine  Date Value Ref Range Status  09/10/2020 145 20 - 275 mg/dL Final         Failed - Valid encounter within last 6 months    Recent Outpatient Visits          6 months ago Essential hypertension   [facility] Bo Merino, FNP   11 months ago Morbid obesity [facility])   [facility] Steele Sizer, MD   1 year ago Essential hypertension   [facility] Steele Sizer, MD   1 year ago [facility] Steele Sizer, MD   2 years ago Uterine leiomyoma, unspecified location   [facility] Steele Sizer, MD             Passed - Last BP in normal range    BP Readings from Last 1 Encounters:  08/06/21 128/78

## 2022-03-01 NOTE — Progress Notes (Signed)
Name: Kelsey Barnes   MRN: [id number]    DOB: 07-05-1980   Date:03/02/2022       Progress Note  Subjective  Chief Complaint  Follow Up  HPI  Morbid obesity  BMI improved but still above 35 with co-morbidities such as HTN, metabolic syndrome, she states her weight went up to 250 lbs, she finally took Ozempic and she lost 19 lbs with medication but no longer covered by insurance, she would like to try [facility]. She has been cutting down on french fries, substituting it fruit .She is currently taking Ozempic  0.5 mg and has helped curb her appetite we will try switching her to [facility] 1 mg weekly   Metabolic syndrome: no longer having hypoglycemic episodes, she is drinking more water and eating healthier with Ozempic.   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 She has difficulty falling asleep and sometimes staying awake during the day. Sometimes she works night shift and day shift in the same week. She has tried otc mediation without help   HTN: compliant with medication, no chest pain or palpitation, bp at home around 130/80's, BP goal should be below 130/80, she would  like to try losing weight and see if bp will help   Goiter: denies dysphagia, no hair loss, we will check TSH  Dyslipidemia: with los ASCVD risk   The 10-year ASCVD risk score (Arnett DK, et al., 2019) is: 1.6%   Values used to calculate the score:     Age: 71 years     Sex: Female     Is Non-Hispanic African American: Yes     Diabetic: No     Tobacco smoker: No     Systolic Blood Pressure: 408 mmHg     Is BP treated: Yes     HDL Cholesterol: 56 mg/dL     Total Cholesterol: 190 mg/dL   Patient Active Problem List   Diagnosis Date Noted   Menorrhagia with regular cycle 03/03/2021   Uterine leiomyoma 03/03/2021   Morbid obesity ([place]) 07/30/2019   Vitamin D deficiency 12/13/2014   Perennial allergic rhinitis 14/48/1856   Metabolic syndrome 31/49/7026   Herpes  genitalis in women 12/13/2014   Goiter 12/13/2014   Dyslipidemia 12/13/2014   Hematuria, microscopic 12/13/2014   Hypertension 09/06/2013   Obesity (BMI 30-39.9) 09/06/2013   H/O myomectomy 05/14/2013    Past Surgical History:  Procedure Laterality Date   APPENDECTOMY  2008   CHOLECYSTECTOMY  2016   MYOMECTOMY  12.19.2014   TONSILLECTOMY AND ADENOIDECTOMY N/A 10/15/2015   Procedure: TONSILLECTOMY ;  Surgeon: Carloyn Manner, MD;  Location: [place];  Service: ENT;  Laterality: N/A;  UPREG    Family History  Problem Relation Age of Onset   Hypertension Mother    Diabetes Mother    COPD Mother    Stroke Mother    Hypertension Father    Heart attack Father    Hypertension Sister    Heart disease Maternal Grandmother    Diabetes Maternal Grandmother    Hypertension Maternal Grandmother    Cancer Maternal Grandfather     Social History   Tobacco Use   Smoking status: Never   Smokeless tobacco: Never  Substance Use Topics   Alcohol use: Yes    Alcohol/week: 0.0 standard drinks of alcohol    Comment: occasionally     Current Outpatient Medications:    atenolol (TENORMIN) 25 MG tablet, TAKE 1 TABLET DAILY,  Disp: 90 tablet, Rfl: 0   cetirizine (ZYRTEC) 10 MG tablet, Take 10 mg by mouth 2 (two) times daily. Am and pm, Disp: , Rfl:    norethindrone-ethinyl estradiol (NECON 0.5/35, 28,) 0.5-35 MG-MCG tablet, Take 1 tablet by mouth daily., Disp: 84 tablet, Rfl: 3   Semaglutide,0.25 or 0.'5MG'$ /DOS, (OZEMPIC, 0.25 OR 0.5 MG/DOSE,) 2 MG/1.5ML SOPN, Inject 0.5 mg into the skin once a week., Disp: 4.5 mL, Rfl: 0   triamterene-hydrochlorothiazide (DYAZIDE) 37.5-25 MG capsule, TAKE 1 CAPSULE DAILY, Disp: 90 capsule, Rfl: 0   valACYclovir (VALTREX) 500 MG tablet, tid, Disp: 100 tablet, Rfl: 3  Allergies  Allergen Reactions   Ace Inhibitors     Per ENT , cough    Percocet [Oxycodone-Acetaminophen] Itching and Other (See Comments)    Hot Flashes    I personally reviewed  active problem list, medication list, allergies, family history, social history, health maintenance with the patient/caregiver today.   ROS  Constitutional: Negative for fever or weight change.  Respiratory: Negative for cough and shortness of breath.   Cardiovascular: Negative for chest pain or palpitations.  Gastrointestinal: Negative for abdominal pain, no bowel changes.  Musculoskeletal: Negative for gait problem or joint swelling.  Skin: Negative for rash.  Neurological: Negative for dizziness or headache.  No other specific complaints in a complete review of systems (except as listed in HPI above).   Objective  Vitals:   03/02/22 1332  BP: 132/86  Pulse: 89  Resp: 16  SpO2: 98%  Weight: 237 lb (107.5 kg)  Height: '5\' 7"'$  (1.702 m)    Body mass index is 37.12 kg/m.  Physical Exam  Constitutional: Patient appears well-developed and well-nourished. Obese  No distress.  HEENT: head atraumatic, normocephalic, pupils equal and reactive to ligh, neck supple Cardiovascular: Normal rate, regular rhythm and normal heart sounds.  No murmur heard. No BLE edema. Pulmonary/Chest: Effort normal and breath sounds normal. No respiratory distress. Abdominal: Soft.  There is no tenderness. Psychiatric: Patient has a normal mood and affect. behavior is normal. Judgment and thought content normal.   PHQ2/9:    03/02/2022    1:31 PM 08/06/2021   11:46 AM 03/03/2021    3:21 PM 09/10/2020    7:43 AM 04/01/2020    8:35 AM  Depression screen PHQ 2/9  Decreased Interest 0 0 0 0 0  Down, Depressed, Hopeless 0 0 0 0 0  PHQ - 2 Score 0 0 0 0 0  Altered sleeping 3 0     Tired, decreased energy 0 0     Change in appetite 0 0     Feeling bad or failure about yourself  0 0     Trouble concentrating 1 0     Moving slowly or fidgety/restless 0 0     Suicidal thoughts 0 0     PHQ-9 Score 4 0     Difficult doing work/chores  Not difficult at all       phq 9 is negative   Fall Risk:     03/02/2022    1:31 PM 08/06/2021   11:45 AM 03/03/2021    3:20 PM 09/10/2020    7:43 AM 04/01/2020    8:35 AM  Fall Risk   Falls in the past year? 0 0 0 0 0  Number falls in past yr: 0 0 0 0 0  Injury with Fall? 0 0 0 0 0  Risk for fall due to : No Fall Risks No Fall Risks No Fall  Risks    Follow up Falls prevention discussed Falls prevention discussed Falls prevention discussed        Functional Status Survey: Is the patient deaf or have difficulty hearing?: No Does the patient have difficulty seeing, even when wearing glasses/contacts?: No Does the patient have difficulty concentrating, remembering, or making decisions?: Yes Does the patient have difficulty walking or climbing stairs?: No Does the patient have difficulty dressing or bathing?: No Does the patient have difficulty doing errands alone such as visiting a doctor's office or shopping?: No    Assessment & Plan  1. Essential hypertension  - atenolol (TENORMIN) 25 MG tablet; Take 1 tablet (25 mg total) by mouth daily.  Dispense: 90 tablet; Refill: 0 - triamterene-hydrochlorothiazide (DYAZIDE) 37.5-25 MG capsule; Take 1 each (1 capsule total) by mouth daily.  Dispense: 90 capsule; Refill: 0 - CBC with Differential/Platelet - COMPLETE METABOLIC PANEL WITH GFR  2. Shift work sleep disorder  - traZODone (DESYREL) 50 MG tablet; Take 0.5-2 tablets (25-100 mg total) by mouth at bedtime as needed for sleep.  Dispense: 30 tablet; Refill: 0  3. Morbid obesity ([place])  - Semaglutide-Weight Management 1 MG/0.5ML SOAJ; Inject 1 mg into the skin once a week.  Dispense: 6 mL; Refill: 0  4. Metabolic syndrome  - Hemoglobin A1c  5. Herpes genitalis in women   6. Dyslipidemia  - Lipid panel  7. Vitamin D deficiency  - VITAMIN D 25 Hydroxy (Vit-D Deficiency, Fractures)  8. Goiter  - TSH  9. Perennial allergic rhinitis   10. Encounter for hepatitis C screening test for low risk patient  - Hepatitis C antibody  11.  Breast cancer screening by mammogram  - MM 3D SCREEN BREAST BILATERAL; Future

## 2022-03-02 ENCOUNTER — Encounter: Payer: Self-pay | Admitting: Family Medicine

## 2022-03-02 ENCOUNTER — Ambulatory Visit: Payer: BC Managed Care – PPO | Admitting: Family Medicine

## 2022-03-02 VITALS — BP 132/86 | HR 89 | Resp 16 | Ht 67.0 in | Wt 237.0 lb

## 2022-03-02 DIAGNOSIS — G4726 Circadian rhythm sleep disorder, shift work type: Secondary | ICD-10-CM | POA: Diagnosis not present

## 2022-03-02 DIAGNOSIS — J3089 Other allergic rhinitis: Secondary | ICD-10-CM

## 2022-03-02 DIAGNOSIS — E8881 Metabolic syndrome: Secondary | ICD-10-CM

## 2022-03-02 DIAGNOSIS — I10 Essential (primary) hypertension: Secondary | ICD-10-CM

## 2022-03-02 DIAGNOSIS — Z1159 Encounter for screening for other viral diseases: Secondary | ICD-10-CM

## 2022-03-02 DIAGNOSIS — E049 Nontoxic goiter, unspecified: Secondary | ICD-10-CM

## 2022-03-02 DIAGNOSIS — E559 Vitamin D deficiency, unspecified: Secondary | ICD-10-CM

## 2022-03-02 DIAGNOSIS — E785 Hyperlipidemia, unspecified: Secondary | ICD-10-CM

## 2022-03-02 DIAGNOSIS — Z1231 Encounter for screening mammogram for malignant neoplasm of breast: Secondary | ICD-10-CM

## 2022-03-02 DIAGNOSIS — A6009 Herpesviral infection of other urogenital tract: Secondary | ICD-10-CM

## 2022-03-02 MED ORDER — ATENOLOL 25 MG PO TABS: 25.0000 mg | ORAL_TABLET | Freq: Every day | ORAL | 0 refills | Status: DC

## 2022-03-02 MED ORDER — TRIAMTERENE-HCTZ 37.5-25 MG PO CAPS: 1.0000 | ORAL_CAPSULE | Freq: Every day | ORAL | 0 refills | Status: DC

## 2022-03-02 MED ORDER — TRAZODONE HCL 50 MG PO TABS: 25.0000 mg | ORAL_TABLET | Freq: Every evening | ORAL | 0 refills | Status: DC | PRN

## 2022-03-02 MED ORDER — SEMAGLUTIDE-WEIGHT MANAGEMENT 1 MG/0.5ML ~~LOC~~ SOAJ: 1.0000 mg | SUBCUTANEOUS | 0 refills | Status: DC

## 2022-03-03 LAB — TSH: TSH: 2.41 mIU/L

## 2022-03-03 LAB — COMPLETE METABOLIC PANEL WITH GFR
AG Ratio: 1.4 (calc) (ref 1.0–2.5)
ALT: 14 U/L (ref 6–29)
AST: 12 U/L (ref 10–30)
Albumin: 4.2 g/dL (ref 3.6–5.1)
Alkaline phosphatase (APISO): 44 U/L (ref 31–125)
BUN: 9 mg/dL (ref 7–25)
CO2: 27 mmol/L (ref 20–32)
Calcium: 9.4 mg/dL (ref 8.6–10.2)
Chloride: 100 mmol/L (ref 98–110)
Creat: 0.71 mg/dL (ref 0.50–0.99)
Globulin: 3.1 g/dL (calc) (ref 1.9–3.7)
Glucose, Bld: 69 mg/dL (ref 65–139)
Potassium: 3.5 mmol/L (ref 3.5–5.3)
Sodium: 137 mmol/L (ref 135–146)
Total Bilirubin: 0.4 mg/dL (ref 0.2–1.2)
Total Protein: 7.3 g/dL (ref 6.1–8.1)
eGFR: 110 mL/min/{1.73_m2} (ref >=60)

## 2022-03-03 LAB — LIPID PANEL
Cholesterol: 203 mg/dL — ABNORMAL HIGH (ref <200)
HDL: 50 mg/dL (ref >=50)
LDL Cholesterol (Calc): 130 mg/dL (calc) — ABNORMAL HIGH
Non-HDL Cholesterol (Calc): 153 mg/dL (calc) — ABNORMAL HIGH (ref <130)
Total CHOL/HDL Ratio: 4.1 (calc) (ref <5.0)
Triglycerides: 124 mg/dL (ref <150)

## 2022-03-03 LAB — CBC WITH DIFFERENTIAL/PLATELET
Absolute Monocytes: 462 cells/uL (ref 200–950)
Basophils Absolute: 68 cells/uL (ref 0–200)
Basophils Relative: 1 %
Eosinophils Absolute: 61 cells/uL (ref 15–500)
Eosinophils Relative: 0.9 %
HCT: 38.3 % (ref 35.0–45.0)
Hemoglobin: 12.8 g/dL (ref 11.7–15.5)
Lymphs Abs: 2387 cells/uL (ref 850–3900)
MCH: 29.4 pg (ref 27.0–33.0)
MCHC: 33.4 g/dL (ref 32.0–36.0)
MCV: 88 fL (ref 80.0–100.0)
MPV: 10.4 fL (ref 7.5–12.5)
Monocytes Relative: 6.8 %
Neutro Abs: 3822 cells/uL (ref 1500–7800)
Neutrophils Relative %: 56.2 %
Platelets: 349 10*3/uL (ref 140–400)
RBC: 4.35 10*6/uL (ref 3.80–5.10)
RDW: 11.9 % (ref 11.0–15.0)
Total Lymphocyte: 35.1 %
WBC: 6.8 10*3/uL (ref 3.8–10.8)

## 2022-03-03 LAB — HEMOGLOBIN A1C
Hgb A1c MFr Bld: 4.7 % of total Hgb (ref <5.7)
Mean Plasma Glucose: 88 mg/dL
eAG (mmol/L): 4.9 mmol/L

## 2022-03-03 LAB — HEPATITIS C ANTIBODY: Hepatitis C Ab: NONREACTIVE

## 2022-03-03 LAB — VITAMIN D 25 HYDROXY (VIT D DEFICIENCY, FRACTURES): Vit D, 25-Hydroxy: 44 ng/mL (ref 30–100)

## 2022-03-31 NOTE — Progress Notes (Unsigned)
Name: Kelsey Barnes   MRN: [id number]    DOB: 08/21/1980   Date:04/01/2022       Progress Note  Subjective  Chief Complaint  No chief complaint on file.   HPI  Patient presents for annual CPE.  Diet: she likes fruit, vegetables and salads, but she eats out frequently  Exercise: discussed regular physical activity   Last Eye Exam: up to date  Last Dental Exam: up to date   [place] Visit from 03/03/2021 in [facility]  AUDIT-C Score 2      Depression: Phq 9 is  negative    04/01/2022   11:59 AM 03/02/2022    1:31 PM 08/06/2021   11:46 AM 03/03/2021    3:21 PM 09/10/2020    7:43 AM  Depression screen PHQ 2/9  Decreased Interest 0 0 0 0 0  Down, Depressed, Hopeless 0 0 0 0 0  PHQ - 2 Score 0 0 0 0 0  Altered sleeping 0 3 0    Tired, decreased energy 0 0 0    Change in appetite 0 0 0    Feeling bad or failure about yourself  0 0 0    Trouble concentrating 0 1 0    Moving slowly or fidgety/restless 0 0 0    Suicidal thoughts 0 0 0    PHQ-9 Score 0 4 0    Difficult doing work/chores Not difficult at all  Not difficult at all     Hypertension: BP Readings from Last 3 Encounters:  04/01/22 122/80  03/02/22 132/86  08/06/21 128/78   Obesity: Wt Readings from Last 3 Encounters:  04/01/22 234 lb 4.8 oz (106.3 kg)  03/02/22 237 lb (107.5 kg)  08/06/21 232 lb 4.8 oz (105.4 kg)   BMI Readings from Last 3 Encounters:  04/01/22 36.70 kg/m  03/02/22 37.12 kg/m  08/06/21 36.38 kg/m     Vaccines:   HPV:  Tdap:  Shingrix:  Pneumonia:  Flu:  COVID-19:   Hep C Screening: up to date  STD testing and prevention (HIV/chl/gon/syphilis): N/A Intimate partner violence: negative screen  Sexual History : Not in years  Menstrual History/LMP/Abnormal Bleeding: LMP 03/22/2022, regular cycles on ocp's Discussed importance of follow up if any post-menopausal bleeding: not applicable  Incontinence Symptoms: negative for symptoms   Breast  cancer:  - Last Mammogram: just turned 41 and will schedule it  - BRCA gene screening: N/A  Osteoporosis Prevention : Discussed high calcium and vitamin D supplementation, weight bearing exercises Bone density :not applicable   Cervical cancer screening:   Skin cancer: Discussed monitoring for atypical lesions  Colorectal cancer: N/A   Lung cancer:  Low Dose CT Chest recommended if Age 61-80 years, 20 pack-year currently smoking OR have quit w/in 15years. Patient does not qualify for screen   ECG: 2015  Advanced Care Planning: A voluntary discussion about advance care planning including the explanation and discussion of advance directives.  Discussed health care proxy and Living will, and the patient was able to identify a health care proxy as sister - Driante.  Patient does not have a living will and power of attorney of health care   Lipids: Lab Results  Component Value Date   CHOL 203 (H) 03/02/2022   CHOL 190 08/06/2021   CHOL 218 (H) 09/10/2020   Lab Results  Component Value Date   HDL 50 03/02/2022   HDL 56 08/06/2021   HDL 55 09/10/2020   Lab Results  Component Value Date   LDLCALC 130 (H) 03/02/2022   LDLCALC 112 (H) 08/06/2021   LDLCALC 136 (H) 09/10/2020   Lab Results  Component Value Date   TRIG 124 03/02/2022   TRIG 117 08/06/2021   TRIG 138 09/10/2020   Lab Results  Component Value Date   CHOLHDL 4.1 03/02/2022   CHOLHDL 3.4 08/06/2021   CHOLHDL 4.0 09/10/2020   No results found for: "LDLDIRECT"  Glucose: Glucose  Date Value Ref Range Status  02/08/2014 87 65 - 99 mg/dL Final  01/29/2014 78 65 - 99 mg/dL Final  01/20/2014 77 65 - 99 mg/dL Final   Glucose, Bld  Date Value Ref Range Status  03/02/2022 69 65 - 139 mg/dL Final    Comment:    .        Non-fasting reference interval .   08/06/2021 86 65 - 99 mg/dL Final    Comment:    .            Fasting reference interval .   09/10/2020 89 65 - 99 mg/dL Final    Comment:    .             Fasting reference interval .     Patient Active Problem List   Diagnosis Date Noted   Menorrhagia with regular cycle 03/03/2021   Uterine leiomyoma 03/03/2021   Morbid obesity ([place]) 07/30/2019   Vitamin D deficiency 12/13/2014   Perennial allergic rhinitis 99/35/7017   Metabolic syndrome 79/39/0300   Herpes genitalis in women 12/13/2014   Goiter 12/13/2014   Dyslipidemia 12/13/2014   Hematuria, microscopic 12/13/2014   Hypertension 09/06/2013   Obesity (BMI 30-39.9) 09/06/2013   H/O myomectomy 05/14/2013    Past Surgical History:  Procedure Laterality Date   APPENDECTOMY  2008   CHOLECYSTECTOMY  2016   MYOMECTOMY  12.19.2014   TONSILLECTOMY AND ADENOIDECTOMY N/A 10/15/2015   Procedure: TONSILLECTOMY ;  Surgeon: Carloyn Manner, MD;  Location: [place];  Service: ENT;  Laterality: N/A;  UPREG    Family History  Problem Relation Age of Onset   Hypertension Mother    Diabetes Mother    COPD Mother    Stroke Mother    Hypertension Father    Heart attack Father    Hypertension Sister    Heart disease Maternal Grandmother    Diabetes Maternal Grandmother    Hypertension Maternal Grandmother    Cancer Maternal Grandfather     Social History   Socioeconomic History   Marital status: Single    Spouse name: Not on file   Number of children: 0   Years of education: Not on file   Highest education level: Associate degree: academic program  Occupational History   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  Tobacco Use   Smoking status: Never   Smokeless tobacco: Never  Vaping Use   Vaping Use: Never used  Substance and Sexual Activity   Alcohol use: Yes    Alcohol/week: 0.0 standard drinks of alcohol    Comment: occasionally   Drug use: No   Sexual activity: Not Currently    Birth control/protection: Pill  Other Topics Concern   Not on file  Social History Narrative   She works full time at [employer]    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 at [employer], graduated 2022     Social Determinants of Health   Financial Resource Strain: Low Risk  (04/01/2022)  Overall Financial Resource Strain (CARDIA)    Difficulty of Paying Living Expenses: Not hard at all  Food Insecurity: No Food Insecurity (04/01/2022)   Hunger Vital Sign    Worried About Running Out of Food in the Last Year: Never true    Ran Out of Food in the Last Year: Never true  Transportation Needs: No Transportation Needs (04/01/2022)   PRAPARE - Hydrologist (Medical): No    Lack of Transportation (Non-Medical): No  Physical Activity: Inactive (04/01/2022)   Exercise Vital Sign    Days of Exercise per Week: 3 days    Minutes of Exercise per Session: 0 min  Stress: Stress Concern Present (04/01/2022)   [place]    Feeling of Stress : To some extent  Social Connections: Socially Isolated (04/01/2022)   Social Connection and Isolation Panel [NHANES]    Frequency of Communication with Friends and Family: More than three times a week    Frequency of Social Gatherings with Friends and Family: Twice a week    Attends Religious Services: Never    Marine scientist or Organizations: No    Attends Archivist Meetings: Never    Marital Status: Never married  Intimate Partner Violence: Not At Risk (04/01/2022)   Humiliation, Afraid, Rape, and Kick questionnaire    Fear of Current or Ex-Partner: No    Emotionally Abused: No    Physically Abused: No    Sexually Abused: No     Current Outpatient Medications:    atenolol (TENORMIN) 25 MG tablet, Take 1 tablet (25 mg total) by mouth daily., Disp: 90 tablet, Rfl: 0   cetirizine (ZYRTEC) 10 MG tablet, Take 10 mg by mouth 2 (two) times daily. Am and pm, Disp: , Rfl:    norethindrone-ethinyl estradiol (NECON 0.5/35, 28,) 0.5-35 MG-MCG tablet, Take 1 tablet by mouth daily., Disp: 84 tablet, Rfl: 3    [START ON 04/29/2022] Semaglutide-Weight Management 1 MG/0.5ML SOAJ, Inject 1 mg into the skin once a week., Disp: 6 mL, Rfl: 0   traZODone (DESYREL) 50 MG tablet, Take 0.5-2 tablets (25-100 mg total) by mouth at bedtime as needed for sleep., Disp: 30 tablet, Rfl: 0   triamterene-hydrochlorothiazide (DYAZIDE) 37.5-25 MG capsule, Take 1 each (1 capsule total) by mouth daily., Disp: 90 capsule, Rfl: 0   valACYclovir (VALTREX) 500 MG tablet, tid, Disp: 100 tablet, Rfl: 3  Allergies  Allergen Reactions   Ace Inhibitors     Per ENT , cough    Percocet [Oxycodone-Acetaminophen] Itching and Other (See Comments)    Hot Flashes     ROS  Constitutional: Negative for fever or weight change.  Respiratory: Negative for cough and shortness of breath.   Cardiovascular: Negative for chest pain or palpitations.  Gastrointestinal: Negative for abdominal pain, no bowel changes.  Musculoskeletal: Negative for gait problem or joint swelling.  Skin: Negative for rash.  Neurological: Negative for dizziness or headache.  No other specific complaints in a complete review of systems (except as listed in HPI above).   Objective  Vitals:   04/01/22 1149  BP: 122/80  Pulse: 75  Resp: 16  Temp: 97.8 F (36.6 C)  TempSrc: Oral  SpO2: 98%  Weight: 234 lb 4.8 oz (106.3 kg)  Height: _0  (1.702 m)    Body mass index is 36.7 kg/m.  Physical Exam  Constitutional: Patient appears well-developed and well-nourished.Obese  No distress.  HENT: Head: Normocephalic  and atraumatic. Ears: B TMs ok, no erythema or effusion; Nose: Nose normal. Mouth/Throat: Oropharynx is clear and moist. No oropharyngeal exudate.  Eyes: Conjunctivae and EOM are normal. Pupils are equal, round, and reactive to light. No scleral icterus.  Neck: Normal range of motion. Neck supple. No JVD present. No thyromegaly present.  Cardiovascular: Normal rate, regular rhythm and normal heart sounds.  No murmur heard. No BLE  edema. Pulmonary/Chest: Effort normal and breath sounds normal. No respiratory distress. Abdominal: Soft. Bowel sounds are normal, no distension. There is no tenderness. no masses Breast: no lumps or masses, no nipple discharge or rashes FEMALE GENITALIA:  External genitalia normal External urethra normal Vaginal vault normal without discharge or lesions Cervix normal without discharge or lesions, friable, nodular uterus  RECTAL: not done  Musculoskeletal: Normal range of motion, no joint effusions. No gross deformities Neurological: he is alert and oriented to person, place, and time. No cranial nerve deficit. Coordination, balance, strength, speech and gait are normal.  Skin: Skin is warm and dry. No rash noted. No erythema.  Psychiatric: Patient has a normal mood and affect. behavior is normal. Judgment and thought content normal.   Recent Results (from the past 2160 hour(s))  Lipid panel     Status: Abnormal   Collection Time: 03/02/22  2:33 PM  Result Value Ref Range   Cholesterol 203 (H) <200 mg/dL   HDL 50 > OR = 50 mg/dL   Triglycerides 124 <150 mg/dL   LDL Cholesterol (Calc) 130 (H) mg/dL (calc)    Comment: Reference range: <100 . Desirable range <100 mg/dL for primary prevention;   <70 mg/dL for patients with CHD or diabetic patients  with > or = 2 CHD risk factors. Marland Kitchen LDL-C is now calculated using the Martin-Hopkins  calculation, which is a validated novel method providing  better accuracy than the Friedewald equation in the  estimation of LDL-C.  Cresenciano Genre et al. Annamaria Helling. 4132;440(10): 2061-2068  (http://education.[facility].com/faq/FAQ164)    Total CHOL/HDL Ratio 4.1 <5.0 (calc)   Non-HDL Cholesterol (Calc) 153 (H) <130 mg/dL (calc)    Comment: For patients with diabetes plus 1 major ASCVD risk  factor, treating to a non-HDL-C goal of <100 mg/dL  (LDL-C of <70 mg/dL) is considered a therapeutic  option.   CBC with Differential/Platelet     Status: None    Collection Time: 03/02/22  2:33 PM  Result Value Ref Range   WBC 6.8 3.8 - 10.8 Thousand/uL   RBC 4.35 3.80 - 5.10 Million/uL   Hemoglobin 12.8 11.7 - 15.5 g/dL   HCT 38.3 35.0 - 45.0 %   MCV 88.0 80.0 - 100.0 fL   MCH 29.4 27.0 - 33.0 pg   MCHC 33.4 32.0 - 36.0 g/dL   RDW 11.9 11.0 - 15.0 %   Platelets 349 140 - 400 Thousand/uL   MPV 10.4 7.5 - 12.5 fL   Neutro Abs 3,822 1,500 - 7,800 cells/uL   Lymphs Abs 2,387 850 - 3,900 cells/uL   Absolute Monocytes 462 200 - 950 cells/uL   Eosinophils Absolute 61 15 - 500 cells/uL   Basophils Absolute 68 0 - 200 cells/uL   Neutrophils Relative % 56.2 %   Total Lymphocyte 35.1 %   Monocytes Relative 6.8 %   Eosinophils Relative 0.9 %   Basophils Relative 1.0 %  COMPLETE METABOLIC PANEL WITH GFR     Status: None   Collection Time: 03/02/22  2:33 PM  Result Value Ref Range   Glucose, Bld 69  65 - 139 mg/dL    Comment: .        Non-fasting reference interval .    BUN 9 7 - 25 mg/dL   Creat 0.71 0.50 - 0.99 mg/dL   eGFR 110 > OR = 60 mL/min/1.63m   BUN/Creatinine Ratio SEE NOTE: 6 - 22 (calc)    Comment:    Not Reported: BUN and Creatinine are within    reference range. .    Sodium 137 135 - 146 mmol/L   Potassium 3.5 3.5 - 5.3 mmol/L   Chloride 100 98 - 110 mmol/L   CO2 27 20 - 32 mmol/L   Calcium 9.4 8.6 - 10.2 mg/dL   Total Protein 7.3 6.1 - 8.1 g/dL   Albumin 4.2 3.6 - 5.1 g/dL   Globulin 3.1 1.9 - 3.7 g/dL (calc)   AG Ratio 1.4 1.0 - 2.5 (calc)   Total Bilirubin 0.4 0.2 - 1.2 mg/dL   Alkaline phosphatase (APISO) 44 31 - 125 U/L   AST 12 10 - 30 U/L   ALT 14 6 - 29 U/L  Hemoglobin A1c     Status: None   Collection Time: 03/02/22  2:33 PM  Result Value Ref Range   Hgb A1c MFr Bld 4.7 <5.7 % of total Hgb    Comment: For the purpose of screening for the presence of diabetes: . <5.7%       Consistent with the absence of diabetes 5.7-6.4%    Consistent with increased risk for diabetes             (prediabetes) > or =6.5%   Consistent with diabetes . This assay result is consistent with a decreased risk of diabetes. . Currently, no consensus exists regarding use of hemoglobin A1c for diagnosis of diabetes in children. . According to American Diabetes Association (ADA) guidelines, hemoglobin A1c <7.0% represents optimal control in non-pregnant diabetic patients. Different metrics may apply to specific patient populations.  Standards of Medical Care in Diabetes(ADA). .    Mean Plasma Glucose 88 mg/dL   eAG (mmol/L) 4.9 mmol/L  Hepatitis C antibody     Status: None   Collection Time: 03/02/22  2:33 PM  Result Value Ref Range   Hepatitis C Ab NON-REACTIVE NON-REACTIVE    Comment: . HCV antibody was non-reactive. There is no laboratory  evidence of HCV infection. . In most cases, no further action is required. However, if recent HCV exposure is suspected, a test for HCV RNA (test code [id number] is suggested. . For additional information please refer to http://education.[facility].com/faq/FAQ22v1 (This link is being provided for informational/ educational purposes only.) .   VITAMIN D 25 Hydroxy (Vit-D Deficiency, Fractures)     Status: None   Collection Time: 03/02/22  2:33 PM  Result Value Ref Range   Vit D, 25-Hydroxy 44 30 - 100 ng/mL    Comment: Vitamin D Status         25-OH Vitamin D: . Deficiency:                    <20 ng/mL Insufficiency:             20 - 29 ng/mL Optimal:                 > or = 30 ng/mL . For 25-OH Vitamin D testing on patients on  D2-supplementation and patients for whom quantitation  of D2 and D3 fractions is required, the [facility]ureD(TM) 25-OH VIT D, (D2,D3), LC/MS/MS is recommended:  order  code [phone] (patients >15yr). . See Note 1 . Note 1 . For additional information, please refer to  http://education.[facility].com/faq/FAQ199  (This link is being provided for informational/ educational purposes only.)   TSH     Status: None   Collection  Time: 03/02/22  2:33 PM  Result Value Ref Range   TSH 2.41 mIU/L    Comment:           Reference Range .           > or = 20 Years  0.40-4.50 .                Pregnancy Ranges           First trimester    0.26-2.66           Second trimester   0.55-2.73           Third trimester    0.43-2.91      Fall Risk:    04/01/2022   11:59 AM 03/02/2022    1:31 PM 08/06/2021   11:45 AM 03/03/2021    3:20 PM 09/10/2020    7:43 AM  FGraftonin the past year? 0 0 0 0 0  Number falls in past yr: 0 0 0 0 0  Injury with Fall? 0 0 0 0 0  Risk for fall due to :  No Fall Risks No Fall Risks No Fall Risks   Follow up  Falls prevention discussed Falls prevention discussed Falls prevention discussed      Functional Status Survey: Is the patient deaf or have difficulty hearing?: No Does the patient have difficulty seeing, even when wearing glasses/contacts?: No Does the patient have difficulty concentrating, remembering, or making decisions?: Yes Does the patient have difficulty walking or climbing stairs?: No Does the patient have difficulty dressing or bathing?: No Does the patient have difficulty doing errands alone such as visiting a doctor's office or shopping?: No   Assessment & Plan  1. Well adult exam  - Cytology - PAP  2. Need for immunization against influenza  She had it at work   3. Encounter for screening mammogram for malignant neoplasm of breast  - MM Digital Screening; Future   -USPSTF grade A and B recommendations reviewed with patient; age-appropriate recommendations, preventive care, screening tests, etc discussed and encouraged; healthy living encouraged; see AVS for patient education given to patient -Discussed importance of 150 minutes of physical activity weekly, eat two servings of fish weekly, eat one serving of tree nuts ( cashews, pistachios, pecans, almonds..Marland Kitchen every other day, eat 6 servings of fruit/vegetables daily and drink plenty of water and avoid  sweet beverages.   -Reviewed Health Maintenance: Yes.

## 2022-04-01 ENCOUNTER — Ambulatory Visit (INDEPENDENT_AMBULATORY_CARE_PROVIDER_SITE_OTHER): Payer: BC Managed Care – PPO | Admitting: Family Medicine

## 2022-04-01 ENCOUNTER — Other Ambulatory Visit (HOSPITAL_COMMUNITY)
Admission: RE | Admit: 2022-04-01 | Discharge: 2022-04-01 | Disposition: A | Discharge status: Home / Self Care | Payer: BLUE CROSS/BLUE SHIELD | Source: Ambulatory Visit | Attending: Family Medicine | Admitting: Family Medicine

## 2022-04-01 ENCOUNTER — Encounter: Payer: Self-pay | Admitting: Family Medicine

## 2022-04-01 VITALS — BP 122/80 | HR 75 | Temp 97.8°F | Resp 16 | Ht 67.0 in | Wt 234.3 lb

## 2022-04-01 DIAGNOSIS — Z1231 Encounter for screening mammogram for malignant neoplasm of breast: Secondary | ICD-10-CM | POA: Diagnosis not present

## 2022-04-01 DIAGNOSIS — Z0000 Encounter for general adult medical examination without abnormal findings: Secondary | ICD-10-CM

## 2022-04-01 DIAGNOSIS — Z23 Encounter for immunization: Secondary | ICD-10-CM

## 2022-04-01 NOTE — Patient Instructions (Signed)
Preventive Care 40-40 Years Old, Female Preventive care refers to lifestyle choices and visits with your health care provider that can promote health and wellness. Preventive care visits are also called wellness exams. What can I expect for my preventive care visit? Counseling Your health care provider may ask you questions about your: Medical history, including: Past medical problems. Family medical history. Pregnancy history. Current health, including: Menstrual cycle. Method of birth control. Emotional well-being. Home life and relationship well-being. Sexual activity and sexual health. Lifestyle, including: Alcohol, nicotine or tobacco, and drug use. Access to firearms. Diet, exercise, and sleep habits. Work and work environment. Sunscreen use. Safety issues such as seatbelt and bike helmet use. Physical exam Your health care provider will check your: Height and weight. These may be used to calculate your BMI (body mass index). BMI is a measurement that tells if you are at a healthy weight. Waist circumference. This measures the distance around your waistline. This measurement also tells if you are at a healthy weight and may help predict your risk of certain diseases, such as type 2 diabetes and high blood pressure. Heart rate and blood pressure. Body temperature. Skin for abnormal spots. What immunizations do I need?  Vaccines are usually given at various ages, according to a schedule. Your health care provider will recommend vaccines for you based on your age, medical history, and lifestyle or other factors, such as travel or where you work. What tests do I need? Screening Your health care provider may recommend screening tests for certain conditions. This may include: Lipid and cholesterol levels. Diabetes screening. This is done by checking your blood sugar (glucose) after you have not eaten for a while (fasting). Pelvic exam and Pap test. Hepatitis B test. Hepatitis C  test. HIV (human immunodeficiency virus) test. STI (sexually transmitted infection) testing, if you are at risk. Lung cancer screening. Colorectal cancer screening. Mammogram. Talk with your health care provider about when you should start having regular mammograms. This may depend on whether you have a family history of breast cancer. BRCA-related cancer screening. This may be done if you have a family history of breast, ovarian, tubal, or peritoneal cancers. Bone density scan. This is done to screen for osteoporosis. Talk with your health care provider about your test results, treatment options, and if necessary, the need for more tests. Follow these instructions at home: Eating and drinking  Eat a diet that includes fresh fruits and vegetables, whole grains, lean protein, and low-fat dairy products. Take vitamin and mineral supplements as recommended by your health care provider. Do not drink alcohol if: Your health care provider tells you not to drink. You are pregnant, may be pregnant, or are planning to become pregnant. If you drink alcohol: Limit how much you have to 0-1 drink a day. Know how much alcohol is in your drink. In the [place] one drink equals one 12 oz bottle of beer (355 mL), one 5 oz glass of wine (148 mL), or one 1 oz glass of hard liquor (44 mL). Lifestyle Brush your teeth every morning and night with fluoride toothpaste. Floss one time each day. Exercise for at least 30 minutes 5 or more days each week. Do not use any products that contain nicotine or tobacco. These products include cigarettes, chewing tobacco, and vaping devices, such as e-cigarettes. If you need help quitting, ask your health care provider. Do not use drugs. If you are sexually active, practice safe sex. Use a condom or other form of protection to   prevent STIs. If you do not wish to become pregnant, use a form of birth control. If you plan to become pregnant, see your health care provider for a  prepregnancy visit. Take aspirin only as told by your health care provider. Make sure that you understand how much to take and what form to take. Work with your health care provider to find out whether it is safe and beneficial for you to take aspirin daily. Find healthy ways to manage stress, such as: Meditation, yoga, or listening to music. Journaling. Talking to a trusted person. Spending time with friends and family. Minimize exposure to UV radiation to reduce your risk of skin cancer. Safety Always wear your seat belt while driving or riding in a vehicle. Do not drive: If you have been drinking alcohol. Do not ride with someone who has been drinking. When you are tired or distracted. While texting. If you have been using any mind-altering substances or drugs. Wear a helmet and other protective equipment during sports activities. If you have firearms in your house, make sure you follow all gun safety procedures. Seek help if you have been physically or sexually abused. What's next? Visit your health care provider once a year for an annual wellness visit. Ask your health care provider how often you should have your eyes and teeth checked. Stay up to date on all vaccines. This information is not intended to replace advice given to you by your health care provider. Make sure you discuss any questions you have with your health care provider. Document Revised: 11/12/2020 Document Reviewed: 11/12/2020 Elsevier Patient Education  [place].

## 2022-04-04 ENCOUNTER — Encounter: Payer: Self-pay | Admitting: Family Medicine

## 2022-04-06 ENCOUNTER — Other Ambulatory Visit: Payer: Self-pay | Admitting: Family Medicine

## 2022-04-06 MED ORDER — SEMAGLUTIDE-WEIGHT MANAGEMENT 1 MG/0.5ML ~~LOC~~ SOAJ: 1.0000 mg | SUBCUTANEOUS | 0 refills | Status: AC

## 2022-04-07 ENCOUNTER — Other Ambulatory Visit: Payer: Self-pay | Admitting: Nurse Practitioner

## 2022-04-07 DIAGNOSIS — A6009 Herpesviral infection of other urogenital tract: Secondary | ICD-10-CM

## 2022-04-07 LAB — CYTOLOGY - PAP: Diagnosis: NEGATIVE

## 2022-04-07 NOTE — Telephone Encounter (Signed)
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 Bo Merino, FNP   1 year ago Morbid obesity [facility])   [facility] Steele Sizer, MD   1 year ago Essential hypertension   [facility] Steele Sizer, MD       Future Appointments             In 4 months Steele Sizer, MD [facility], [place]   In 1 year Steele Sizer, MD [facility], [facility]

## 2022-05-25 ENCOUNTER — Other Ambulatory Visit: Payer: Self-pay | Admitting: Family Medicine

## 2022-05-25 DIAGNOSIS — I10 Essential (primary) hypertension: Secondary | ICD-10-CM

## 2022-07-16 ENCOUNTER — Other Ambulatory Visit: Payer: Self-pay | Admitting: Nurse Practitioner

## 2022-07-16 DIAGNOSIS — Z3041 Encounter for surveillance of contraceptive pills: Secondary | ICD-10-CM

## 2022-07-16 NOTE — Telephone Encounter (Signed)
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)   [facility] Steele Sizer, MD   1 year ago Essential hypertension   [facility] Steele Sizer, MD       Future Appointments             In 1 month Steele Sizer, MD [facility], [place]   In 8 months Steele Sizer, MD [facility], [place] - Patient is not a smoker

## 2022-07-23 ENCOUNTER — Other Ambulatory Visit: Payer: Self-pay | Admitting: Family Medicine

## 2022-07-23 ENCOUNTER — Other Ambulatory Visit: Payer: Self-pay | Admitting: Nurse Practitioner

## 2022-07-23 DIAGNOSIS — Z3041 Encounter for surveillance of contraceptive pills: Secondary | ICD-10-CM

## 2022-07-23 DIAGNOSIS — I10 Essential (primary) hypertension: Secondary | ICD-10-CM

## 2022-07-23 MED ORDER — TRIAMTERENE-HCTZ 37.5-25 MG PO CAPS: 1.0000 | ORAL_CAPSULE | Freq: Every day | ORAL | 0 refills | Status: DC

## 2022-07-23 MED ORDER — NORTREL 0.5/35 (28) 0.5-35 MG-MCG PO TABS: 1.0000 | ORAL_TABLET | Freq: Every day | ORAL | 0 refills | Status: DC

## 2022-07-23 MED ORDER — ATENOLOL 25 MG PO TABS: 25.0000 mg | ORAL_TABLET | Freq: Every day | ORAL | 0 refills | Status: DC

## 2022-07-23 NOTE — Telephone Encounter (Signed)
Patient requesting 3 days supply of medication- she is out of town Requested Prescriptions  Pending Prescriptions Disp Refills   atenolol (TENORMIN) 25 MG tablet 90 tablet 0    Sig: Take 1 tablet (25 mg total) by mouth daily.     Cardiovascular: Beta Blockers 2 Passed - 07/23/2022  2:30 PM      Passed - Cr in normal range and within 360 days    Creat  Date Value Ref Range Status  03/02/2022 0.71 0.50 - 0.99 mg/dL Final   Creatinine, Urine  Date Value Ref Range Status  09/10/2020 145 20 - 275 mg/dL Final         Passed - Last BP in normal range    BP Readings from Last 1 Encounters:  04/01/22 122/80         Passed - Last Heart Rate in normal range    Pulse Readings from Last 1 Encounters:  04/01/22 75         Passed - Valid encounter within last 6 months    Recent Outpatient Visits           3 months ago Well adult exam   [facility] Steele Sizer, MD   4 months 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   [facility] Steele Sizer, MD   11 months ago Essential hypertension   [facility] Bo Merino, FNP   1 year ago Morbid obesity [facility])   [facility] Steele Sizer, MD   1 year ago Essential hypertension   [facility] Steele Sizer, MD       Future Appointments             In 1 month Steele Sizer, MD [facility], [place]   In 8 months Steele Sizer, MD [facility]             triamterene-hydrochlorothiazide (DYAZIDE) 37.5-25 MG capsule 90 capsule 0    Sig: Take 1 each (1 capsule total) by mouth daily.     Cardiovascular: Diuretic Combos Passed - 07/23/2022  2:30 PM      Passed - K in normal range and within 180 days    Potassium  Date Value Ref Range Status  03/02/2022 3.5 3.5 - 5.3 mmol/L Final  02/08/2014 3.8 3.5 - 5.1 mmol/L Final         Passed - Na in  normal range and within 180 days    Sodium  Date Value Ref Range Status  03/02/2022 137 135 - 146 mmol/L Final  04/22/2017 140 137 - 147 Final  02/08/2014 140 136 - 145 mmol/L Final         Passed - Cr in normal range and within 180 days    Creat  Date Value Ref Range Status  03/02/2022 0.71 0.50 - 0.99 mg/dL Final   Creatinine, Urine  Date Value Ref Range Status  09/10/2020 145 20 - 275 mg/dL Final         Passed - Last BP in normal range    BP Readings from Last 1 Encounters:  04/01/22 122/80         Passed - Valid encounter within last 6 months    Recent Outpatient Visits           3 months ago Well adult exam   [facility] Steele Sizer, MD   4 months  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   [facility] Steele Sizer, MD   11 months ago Essential hypertension   [facility] Bo Merino, FNP   1 year ago Morbid obesity [place])   [facility] Steele Sizer, MD   1 year ago Essential hypertension   [facility] Steele Sizer, MD       Future Appointments             In 1 month Steele Sizer, MD [facility], [place]   In 8 months Steele Sizer, MD [place]             norethindrone-ethinyl estradiol (NORTREL 0.5/35, 28,) 0.5-35 MG-MCG tablet 84 tablet 0    Sig: Take 1 tablet by mouth daily.     OB/GYN:  Contraceptives Passed - 07/23/2022  2:30 PM      Passed - Last BP in normal range    BP Readings from Last 1 Encounters:  04/01/22 122/80         Passed - Valid encounter within last 12 months    Recent Outpatient Visits           3 months ago Well adult exam   [facility] Steele Sizer, MD   4 months 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   [facility] Steele Sizer, MD   11 months ago Essential hypertension    [facility] Bo Merino, FNP   1 year ago Morbid obesity [facility])   [facility] Steele Sizer, MD   1 year ago Essential hypertension   [facility] Steele Sizer, MD       Future Appointments             In 1 month Steele Sizer, MD [facility], [place]   In 8 months Steele Sizer, MD [facility], [place] - Patient is not a smoker

## 2022-07-23 NOTE — Telephone Encounter (Signed)
Medication Refill - Medication: atenolol (TENORMIN) 25 MG tablet  triamterene-hydrochlorothiazide (DYAZIDE) 37.5-25 MG capsule  norethindrone-ethinyl estradiol (NORTREL 0.5/35, 28,) 0.5-35 MG-MCG tablet    Pt has left town without her Rx, she needs 3 days supply Has the patient contacted their pharmacy? Yes.   (Agent: If no, request that the patient contact the pharmacy for the refill. If patient does not wish to contact the pharmacy document the reason why and proceed with request.) (Agent: If yes, when and what did the pharmacy advise?)  Preferred Pharmacy (with phone number or street name):  [id number] [place] [id number]  [address] Phone: [phone]: [phone]  Has the patient been seen for an appointment in the last year OR does the patient have an upcoming appointment? Yes.    Agent: Please be advised that RX refills may take up to 3 business days. We ask that you follow-up with your pharmacy.

## 2022-07-23 NOTE — Telephone Encounter (Signed)
Please clarify amount dispensed for Nortrel.

## 2022-08-02 ENCOUNTER — Other Ambulatory Visit: Payer: Self-pay | Admitting: Family Medicine

## 2022-08-02 DIAGNOSIS — I10 Essential (primary) hypertension: Secondary | ICD-10-CM

## 2022-09-01 NOTE — Progress Notes (Unsigned)
Name: Kelsey Barnes   MRN: [id number]    DOB: 01/12/1981   Date:09/02/2022       Progress Note  Subjective  Chief Complaint  Follow Up  HPI  Morbid obesity  BMI improved but still above 35 with co-morbidities such as HTN, metabolic syndrome, she states her weight went up to 250 lbs, she finally took [place] and she had lost 19 lbs with medication but no longer covered by insurance, she would like to try [facility]. She has been cutting down on french fries, substituting it fruit .She recently started on Wegovy, she had a gap and resumed at lower dose. Weigh is 5 lbs higher due to a gap on medication , she states no longer covered by insurance. Discussed weight loss center with patient . 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 She has difficulty falling asleep and sometimes staying awake during the day. Sometimes she works night shift and day shift in the same week. She has tried otc mediation without help She tried Trazodone but did not work , she would like to try seroquel   HTN: compliant with medication, no chest pain or palpitation, she has not been checking her bp at home  BP goal should be below 130/80, last visit is was at goal but today slightly up, she is working nights and came in a hurry   AR: stable at this time, taking ot c medication  Goiter: denies dysphagia, no hair loss, last TSH was normal   Dyslipidemia: with los ASCVD risk   The 10-year ASCVD risk score (Arnett DK, et al., 2019) is: 1.6%   Values used to calculate the score:     Age: 73 years     Sex: Female     Is Non-Hispanic African American: Yes     Diabetic: No     Tobacco smoker: No      Systolic Blood Pressure: [id number] mmHg     Is BP treated: Yes     HDL Cholesterol: 56 mg/dL     Total Cholesterol: 190 mg/dL   Patient Active Problem List   Diagnosis Date Noted   Menorrhagia with regular cycle 03/03/2021   Uterine leiomyoma 03/03/2021   Morbid obesity 07/30/2019   Vitamin D deficiency 12/13/2014   Perennial allergic rhinitis [id number]   Metabolic syndrome [id number]   Herpes genitalis in women 12/13/2014   Goiter 12/13/2014   Dyslipidemia 12/13/2014   Hematuria, microscopic 12/13/2014   Hypertension 09/06/2013   Obesity (BMI 30-39.9) 09/06/2013   H/O myomectomy 05/14/2013    Past Surgical History:  Procedure Laterality Date   APPENDECTOMY  2008   CHOLECYSTECTOMY  2016   MYOMECTOMY  12.19.2014   TONSILLECTOMY AND ADENOIDECTOMY N/A 10/15/2015   Procedure: TONSILLECTOMY ;  Surgeon: Carloyn Manner, MD;  Location: [facility];  Service: ENT;  Laterality: N/A;  UPREG    Family History  Problem Relation Age of Onset   Hypertension Mother    Diabetes Mother    COPD Mother    Stroke Mother    Hypertension Father    Heart attack Father    Hypertension Sister    Heart disease Maternal Grandmother  Diabetes Maternal Grandmother    Hypertension Maternal Grandmother    Cancer Maternal Grandfather     Social History   Tobacco Use   Smoking status: Never   Smokeless tobacco: Never  Substance Use Topics   Alcohol use: Yes    Alcohol/week: 0.0 standard drinks of alcohol    Comment: occasionally     Current Outpatient Medications:    atenolol (TENORMIN) 25 MG tablet, Take 1 tablet (25 mg total) by mouth daily., Disp: 3 tablet, Rfl: 0   cetirizine (ZYRTEC) 10 MG tablet, Take 10 mg by mouth 2 (two) times daily. Am and pm, Disp: , Rfl:    norethindrone-ethinyl estradiol (NORTREL 0.5/35, 28,) 0.5-35 MG-MCG tablet, TAKE 1 TABLET BY MOUTH EVERY DAY, Disp: 30 tablet, Rfl: 5   traZODone (DESYREL) 50 MG tablet, Take 0.5-2 tablets (25-100 mg total) by mouth  at bedtime as needed for sleep., Disp: 30 tablet, Rfl: 0   triamterene-hydrochlorothiazide (DYAZIDE) 37.5-25 MG capsule, TAKE 1 CAPSULE DAILY, Disp: 30 capsule, Rfl: 0   valACYclovir (VALTREX) 500 MG tablet, TAKE 1 TABLET 3 TIMES A DAY, Disp: 100 tablet, Rfl: 3  Allergies  Allergen Reactions   Ace Inhibitors     Per ENT , cough    Percocet [Oxycodone-Acetaminophen] Itching and Other (See Comments)    Hot Flashes    I personally reviewed active problem list, medication list, allergies, family history, social history, health maintenance, notes from last encounter with the patient/caregiver today.   ROS  Ten systems reviewed and is negative except as mentioned in HPI   Objective  Vitals:   09/02/22 1157  BP: 132/68  Pulse: 87  Resp: 16  SpO2: 98%  Weight: 239 lb (108.4 kg)  Height: 5\' 7"  (1.702 m)    Body mass index is 37.43 kg/m.  Physical Exam  Constitutional: Patient appears well-developed and well-nourished. Obese  No distress.  HEENT: head atraumatic, normocephalic, pupils equal and reactive to light, neck supple Cardiovascular: Normal rate, regular rhythm and normal heart sounds.  No murmur heard. No BLE edema. Pulmonary/Chest: Effort normal and breath sounds normal. No respiratory distress. Abdominal: Soft.  There is no tenderness. Psychiatric: Patient has a normal mood and affect. behavior is normal. Judgment and thought content normal.   PHQ2/9:    09/02/2022   11:56 AM 04/01/2022   11:59 AM 03/02/2022    1:31 PM 08/06/2021   11:46 AM 03/03/2021    3:21 PM  Depression screen PHQ 2/9  Decreased Interest 0 0 0 0 0  Down, Depressed, Hopeless 0 0 0 0 0  PHQ - 2 Score 0 0 0 0 0  Altered sleeping 0 0 3 0   Tired, decreased energy 0 0 0 0   Change in appetite 0 0 0 0   Feeling bad or failure about yourself  0 0 0 0   Trouble concentrating 0 0 1 0   Moving slowly or fidgety/restless 0 0 0 0   Suicidal thoughts 0 0 0 0   PHQ-9 Score 0 0 4 0   Difficult doing  work/chores  Not difficult at all  Not difficult at all     phq 9 is negative   Fall Risk:    09/02/2022   11:55 AM 04/01/2022   11:59 AM 03/02/2022    1:31 PM 08/06/2021   11:45 AM 03/03/2021    3:20 PM  Fall Risk   Falls in the past year? 0 0 0 0 0  Number falls in past  yr: 0 0 0 0 0  Injury with Fall? 0 0 0 0 0  Risk for fall due to : No Fall Risks  No Fall Risks No Fall Risks No Fall Risks  Follow up Falls prevention discussed  Falls prevention discussed Falls prevention discussed Falls prevention discussed      Functional Status Survey: Is the patient deaf or have difficulty hearing?: No Does the patient have difficulty seeing, even when wearing glasses/contacts?: No Does the patient have difficulty concentrating, remembering, or making decisions?: Yes Does the patient have difficulty walking or climbing stairs?: No Does the patient have difficulty dressing or bathing?: No Does the patient have difficulty doing errands alone such as visiting a doctor's office or shopping?: No    Assessment & Plan  1. Essential hypertension  - atenolol (TENORMIN) 50 MG tablet; Take 1 tablet (50 mg total) by mouth daily.  Dispense: 90 tablet; Refill: 1 - triamterene-hydrochlorothiazide (DYAZIDE) 37.5-25 MG capsule; Take 1 each (1 capsule total) by mouth daily.  Dispense: 90 capsule; Refill: 1  2. Morbid obesity  Discussed all optiosns for weight loss medications including Belviq, Qsymia, Saxenda and Contrave. Discussed risk and benefits of each of them.   3. Shift work sleep disorder  - QUEtiapine (SEROQUEL) 25 MG tablet; Take 1 tablet (25 mg total) by mouth at bedtime.  Dispense: 30 tablet; Refill: 0  4. Metabolic syndrome  - metFORMIN (GLUCOPHAGE) 500 MG tablet; Take 1 tablet (500 mg total) by mouth 2 (two) times daily with a meal.  Dispense: 180 tablet; Refill: 1  5. Dyslipidemia   6. Perennial allergic rhinitis   7. Vitamin D deficiency   8. Goiter

## 2022-09-02 ENCOUNTER — Ambulatory Visit: Payer: BC Managed Care – PPO | Admitting: Family Medicine

## 2022-09-02 ENCOUNTER — Encounter: Payer: Self-pay | Admitting: Family Medicine

## 2022-09-02 DIAGNOSIS — G4726 Circadian rhythm sleep disorder, shift work type: Secondary | ICD-10-CM

## 2022-09-02 DIAGNOSIS — E559 Vitamin D deficiency, unspecified: Secondary | ICD-10-CM

## 2022-09-02 DIAGNOSIS — I10 Essential (primary) hypertension: Secondary | ICD-10-CM | POA: Diagnosis not present

## 2022-09-02 DIAGNOSIS — J3089 Other allergic rhinitis: Secondary | ICD-10-CM

## 2022-09-02 DIAGNOSIS — E8881 Metabolic syndrome: Secondary | ICD-10-CM | POA: Diagnosis not present

## 2022-09-02 DIAGNOSIS — E785 Hyperlipidemia, unspecified: Secondary | ICD-10-CM

## 2022-09-02 DIAGNOSIS — E049 Nontoxic goiter, unspecified: Secondary | ICD-10-CM

## 2022-09-02 MED ORDER — ATENOLOL 50 MG PO TABS: 50.0000 mg | ORAL_TABLET | Freq: Every day | ORAL | 1 refills | Status: DC

## 2022-09-02 MED ORDER — TRIAMTERENE-HCTZ 37.5-25 MG PO CAPS: 1.0000 | ORAL_CAPSULE | Freq: Every day | ORAL | 1 refills | Status: DC

## 2022-09-02 MED ORDER — METFORMIN HCL 500 MG PO TABS
500.0000 mg | ORAL_TABLET | Freq: Two times a day (BID) | ORAL | 1 refills | Status: DC
Start: 2022-09-02 — End: 2023-03-10

## 2022-09-02 MED ORDER — QUETIAPINE FUMARATE 25 MG PO TABS: 25.0000 mg | ORAL_TABLET | Freq: Every day | ORAL | 0 refills | Status: DC

## 2022-09-29 ENCOUNTER — Ambulatory Visit
Admission: RE | Admit: 2022-09-29 | Discharge: 2022-09-29 | Disposition: A | Discharge status: Home / Self Care | Payer: BC Managed Care – PPO | Source: Ambulatory Visit | Attending: Family Medicine | Admitting: Family Medicine

## 2022-09-29 DIAGNOSIS — Z1231 Encounter for screening mammogram for malignant neoplasm of breast: Secondary | ICD-10-CM | POA: Diagnosis present

## 2022-09-30 ENCOUNTER — Other Ambulatory Visit: Payer: Self-pay | Admitting: Family Medicine

## 2022-09-30 DIAGNOSIS — R928 Other abnormal and inconclusive findings on diagnostic imaging of breast: Secondary | ICD-10-CM

## 2022-09-30 DIAGNOSIS — N63 Unspecified lump in unspecified breast: Secondary | ICD-10-CM

## 2022-10-01 ENCOUNTER — Other Ambulatory Visit: Payer: Self-pay | Admitting: Nurse Practitioner

## 2022-10-01 DIAGNOSIS — Z3041 Encounter for surveillance of contraceptive pills: Secondary | ICD-10-CM

## 2022-10-01 NOTE — Telephone Encounter (Signed)
Unable to refill per protocol, Rx request is too soon. Last refill 07/26/22 for 30 days and 5 refills.  Requested Prescriptions  Pending Prescriptions Disp Refills   NORTREL 0.5/35, 28, 0.5-35 MG-MCG tablet [Pharmacy Med Name: NORTREL (28) TAB 0.5/35] 84 tablet 0    Sig: TAKE 1 TABLET DAILY     OB/GYN:  Contraceptives Passed - 10/01/2022  8:09 AM      Passed - Last BP in normal range    BP Readings from Last 1 Encounters:  09/02/22 132/68         Passed - Valid encounter within last 12 months    Recent Outpatient Visits           4 weeks ago Morbid obesity [facility])   [facility] Alba Cory, MD   6 months ago Well adult exam   [facility] Alba Cory, MD   7 months 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   [facility] Alba Cory, MD   1 year ago Essential hypertension   [facility] Berniece Salines, FNP   1 year ago Morbid obesity [facility])   [facility] Alba Cory, MD       Future Appointments             In 5 months Carlynn Purl, Danna Hefty, MD [facility]   In 6 months Alba Cory, MD [facility], [facility]            Passed - Patient is not a smoker

## 2022-10-19 ENCOUNTER — Ambulatory Visit
Admission: RE | Admit: 2022-10-19 | Discharge: 2022-10-19 | Disposition: A | Discharge status: Home / Self Care | Payer: BC Managed Care – PPO | Source: Ambulatory Visit | Attending: Family Medicine | Admitting: Family Medicine

## 2022-10-19 DIAGNOSIS — N63 Unspecified lump in unspecified breast: Secondary | ICD-10-CM | POA: Insufficient documentation

## 2022-10-19 DIAGNOSIS — R928 Other abnormal and inconclusive findings on diagnostic imaging of breast: Secondary | ICD-10-CM | POA: Diagnosis present

## 2022-12-06 ENCOUNTER — Encounter: Payer: Self-pay | Admitting: Family Medicine

## 2022-12-30 ENCOUNTER — Other Ambulatory Visit: Payer: Self-pay | Admitting: Family Medicine

## 2022-12-30 DIAGNOSIS — Z3041 Encounter for surveillance of contraceptive pills: Secondary | ICD-10-CM

## 2022-12-30 MED ORDER — NORTREL 0.5/35 (28) 0.5-35 MG-MCG PO TABS
1.0000 | ORAL_TABLET | Freq: Every day | ORAL | 1 refills | Status: DC
Start: 2022-12-30 — End: 2023-03-17

## 2023-02-20 ENCOUNTER — Encounter: Payer: Self-pay | Admitting: Family Medicine

## 2023-03-03 ENCOUNTER — Other Ambulatory Visit: Payer: Self-pay | Admitting: Family Medicine

## 2023-03-03 DIAGNOSIS — I10 Essential (primary) hypertension: Secondary | ICD-10-CM

## 2023-03-09 NOTE — Progress Notes (Deleted)
Name: Kelsey Barnes   MRN: [id number]    DOB: 1980-10-23   Date:03/09/2023       Progress Note  Subjective  Chief Complaint  Follow Up  HPI  Morbid obesity  BMI improved but still above 35 with co-morbidities such as HTN, metabolic syndrome, she states her weight went up to 250 lbs, she finally took Ozempic and she had lost 19 lbs with medication but no longer covered by insurance, she would like to try [facility]. She has been cutting down on french fries, substituting it fruit .She recently started on Wegovy, she had a gap and resumed at lower dose. Weigh is 5 lbs higher due to a gap on medication , she states no longer covered by insurance. Discussed weight loss center with patient . 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 She has difficulty falling asleep and sometimes staying awake during the day. Sometimes she works night shift and day shift in the same week. She has tried otc mediation without help She tried Trazodone but did not work , she would like to try seroquel   HTN: compliant with medication, no chest pain or palpitation, she has not been checking her bp at home  BP goal should be below 130/80, last visit is was at goal but today slightly up, she is working nights and came in a hurry   AR: stable at this time, taking ot c medication  Goiter: denies dysphagia, no hair loss, last TSH was normal   Dyslipidemia: with los ASCVD risk   The 10-year ASCVD risk score (Arnett DK, et al., 2019) is: 1.6%   Values used to calculate the score:     Age: 42 years     Sex: Female     Is Non-Hispanic African American: Yes     Diabetic: No     Tobacco smoker: No      Systolic Blood Pressure: 132 mmHg     Is BP treated: Yes     HDL Cholesterol: 56 mg/dL     Total Cholesterol: 190 mg/dL   Patient Active Problem List   Diagnosis Date Noted   Menorrhagia with regular cycle 03/03/2021   Uterine leiomyoma 03/03/2021   Morbid obesity (HCC) 07/30/2019   Vitamin D deficiency 12/13/2014   Perennial allergic rhinitis 12/13/2014   Metabolic syndrome 12/13/2014   Herpes genitalis in women 12/13/2014   Goiter 12/13/2014   Dyslipidemia 12/13/2014   Hematuria, microscopic 12/13/2014   Hypertension 09/06/2013   Obesity (BMI 30-39.9) 09/06/2013   H/O myomectomy 05/14/2013    Past Surgical History:  Procedure Laterality Date   APPENDECTOMY  2008   CHOLECYSTECTOMY  2016   MYOMECTOMY  12.19.2014   TONSILLECTOMY AND ADENOIDECTOMY N/A 10/15/2015   Procedure: TONSILLECTOMY ;  Surgeon: Bud Face, MD;  [facility];  Service: ENT;  Laterality: N/A;  UPREG    Family History  Problem Relation Age of Onset   Hypertension Mother    Diabetes Mother    COPD Mother    Stroke Mother    Hypertension Father    Heart attack Father    Hypertension Sister    Breast cancer Maternal Aunt  Heart disease Maternal Grandmother    Diabetes Maternal Grandmother    Hypertension Maternal Grandmother    Cancer Maternal Grandfather     Social History   Tobacco Use   Smoking status: Never   Smokeless tobacco: Never  Substance Use Topics   Alcohol use: Yes    Alcohol/week: 0.0 standard drinks of alcohol    Comment: occasionally     Current Outpatient Medications:    atenolol (TENORMIN) 50 MG tablet, Take 1 tablet (50 mg total) by mouth daily., Disp: 90 tablet, Rfl: 1   cetirizine (ZYRTEC) 10 MG tablet, Take 10 mg by mouth 2 (two) times daily. Am and pm, Disp: , Rfl:    metFORMIN (GLUCOPHAGE) 500 MG tablet, Take 1 tablet (500 mg total) by mouth 2 (two) times daily with a meal., Disp: 180 tablet, Rfl: 1   norethindrone-ethinyl estradiol (NORTREL  0.5/35, 28,) 0.5-35 MG-MCG tablet, Take 1 tablet by mouth daily., Disp: 84 tablet, Rfl: 1   QUEtiapine (SEROQUEL) 25 MG tablet, Take 1 tablet (25 mg total) by mouth at bedtime., Disp: 30 tablet, Rfl: 0   triamterene-hydrochlorothiazide (DYAZIDE) 37.5-25 MG capsule, TAKE 1 CAPSULE DAILY, Disp: 90 capsule, Rfl: 0   valACYclovir (VALTREX) 500 MG tablet, TAKE 1 TABLET 3 TIMES A DAY, Disp: 100 tablet, Rfl: 3  Allergies  Allergen Reactions   Ace Inhibitors     Per ENT , cough    Percocet [Oxycodone-Acetaminophen] Itching and Other (See Comments)    Hot Flashes    I personally reviewed active problem list, medication list, allergies, family history, social history, health maintenance with the patient/caregiver today.   ROS  ***  Objective  There were no vitals filed for this visit.  There is no height or weight on file to calculate BMI.  Physical Exam ***  No results found for this or any previous visit (from the past 2160 hour(s)).   PHQ2/9:    09/02/2022   11:56 AM 04/01/2022   11:59 AM 03/02/2022    1:31 PM 08/06/2021   11:46 AM 03/03/2021    3:21 PM  Depression screen PHQ 2/9  Decreased Interest 0 0 0 0 0  Down, Depressed, Hopeless 0 0 0 0 0  PHQ - 2 Score 0 0 0 0 0  Altered sleeping 0 0 3 0   Tired, decreased energy 0 0 0 0   Change in appetite 0 0 0 0   Feeling bad or failure about yourself  0 0 0 0   Trouble concentrating 0 0 1 0   Moving slowly or fidgety/restless 0 0 0 0   Suicidal thoughts 0 0 0 0   PHQ-9 Score 0 0 4 0   Difficult doing work/chores  Not difficult at all  Not difficult at all     phq 9 is {gen pos [id number]}   Fall Risk:    09/02/2022   11:55 AM 04/01/2022   11:59 AM 03/02/2022    1:31 PM 08/06/2021   11:45 AM 03/03/2021    3:20 PM  Fall Risk   Falls in the past year? 0 0 0 0 0  Number falls in past yr: 0 0 0 0 0  Injury with Fall? 0 0 0 0 0  Risk for fall due to : No Fall Risks  No Fall Risks No Fall Risks No Fall Risks  Follow up Falls  prevention discussed  Falls prevention discussed Falls prevention discussed Falls prevention discussed      Functional Status Survey:  Assessment & Plan  *** There are no diagnoses linked to this encounter.

## 2023-03-10 ENCOUNTER — Encounter: Payer: Self-pay | Admitting: Physician Assistant

## 2023-03-10 ENCOUNTER — Ambulatory Visit: Payer: Managed Care, Other (non HMO) | Admitting: Physician Assistant

## 2023-03-10 ENCOUNTER — Ambulatory Visit: Payer: BC Managed Care – PPO | Admitting: Family Medicine

## 2023-03-10 VITALS — BP 122/80 | HR 56 | Temp 97.8°F | Resp 16 | Ht 67.0 in | Wt 231.5 lb

## 2023-03-10 DIAGNOSIS — E669 Obesity, unspecified: Secondary | ICD-10-CM

## 2023-03-10 DIAGNOSIS — E8881 Metabolic syndrome: Secondary | ICD-10-CM

## 2023-03-10 DIAGNOSIS — I10 Essential (primary) hypertension: Secondary | ICD-10-CM | POA: Diagnosis not present

## 2023-03-10 DIAGNOSIS — E785 Hyperlipidemia, unspecified: Secondary | ICD-10-CM | POA: Diagnosis not present

## 2023-03-10 DIAGNOSIS — E049 Nontoxic goiter, unspecified: Secondary | ICD-10-CM | POA: Diagnosis not present

## 2023-03-10 DIAGNOSIS — E559 Vitamin D deficiency, unspecified: Secondary | ICD-10-CM

## 2023-03-10 DIAGNOSIS — A6009 Herpesviral infection of other urogenital tract: Secondary | ICD-10-CM

## 2023-03-10 MED ORDER — VALACYCLOVIR HCL 500 MG PO TABS: 500.0000 mg | ORAL_TABLET | Freq: Three times a day (TID) | ORAL | 0 refills | Status: DC

## 2023-03-10 MED ORDER — ATENOLOL 50 MG PO TABS: 50.0000 mg | ORAL_TABLET | Freq: Every day | ORAL | 1 refills | Status: DC

## 2023-03-10 NOTE — Assessment & Plan Note (Signed)
Chronic, ongoing Recheck TSH and T4 today for monitoring Results to dictate further management

## 2023-03-10 NOTE — Patient Instructions (Addendum)
Please call your insurance company and ask if they cover weight loss medications- ask specifically if they cover Wegovy and Zepbound for weight loss.  Please try to wear compression stockings. Use your shoe size and then measure around the widest part of your calf to get the appropriate size stocking I typically recommend 15-25 mmHg compression force for daily wear. Please put these on first thing in the morning and wear until the evening time.  These should not be painful or cut into your legs but they may be pretty snug. If they cause pain, please take them off.  I recommend getting them from a medical supply store or a website called [employer]   It was nice to meet you and I appreciate the opportunity to be involved in your care If you were satisfied with the care you received from me, I would greatly appreciate you saying so in the after-visit survey that is sent out following our visit.

## 2023-03-10 NOTE — Assessment & Plan Note (Signed)
Recheck labs today  Results to dictate further management

## 2023-03-10 NOTE — Assessment & Plan Note (Signed)
Chronic, ongoing, episodic Patient has Valtrex 500 mg p.o. 3 times daily as needed to use for outbreaks/flares Refills provided today Follow-up as needed for concerns

## 2023-03-10 NOTE — Progress Notes (Signed)
Established Patient Office Visit  Name: Kelsey Barnes   MRN: [id number]    DOB: 28-Feb-1981   Date:03/10/2023  Today's Provider: Jacquelin Hawking, MHS, PA-C Introduced myself to the patient as a PA-C and provided education on APPs in clinical practice.         Subjective  Chief Complaint  Chief Complaint  Patient presents with   Hyperlipidemia   Hypertension    Pt noticed with starting atenolol heart rate been on the lower end    HPI   HYPERTENSION / HYPERLIPIDEMIA Satisfied with current treatment? yes  Duration of hypertension: years BP monitoring frequency: a few times a month BP range: usually in 130s/85  BP medication side effects: yes- feels like her HR is lower  Thinks her respiration rate is changed as well States her HR has been in the 50s lately rather than normal of 70s  Past BP meds: Triamterene- hydrochlorothiazide 37.5-25 mg PO every day, atenolol 50 mg PO every day  Duration of hyperlipidemia: chronic Cholesterol medication side effects: NA Cholesterol supplements: none Past cholesterol medications: none  Medication compliance: NA Aspirin: no Recent stressors: no Recurrent headaches: yes Visual changes: no Palpitations: no Dyspnea: no Chest pain: no Lower extremity edema: yes Dizzy/lightheaded: no   Metabolic syndrome/ Obesity  She has not been taking Metformin  She has recently switched insurance so she would like to switch back to injections  She would like to discuss restarting this today  She was on Wegovy and denies having any issues with the medication  She reports she had success with decreasing her portion sizes and feeling fuller longer      Patient Active Problem List   Diagnosis Date Noted   Menorrhagia with regular cycle 03/03/2021   Uterine leiomyoma 03/03/2021   Morbid obesity (HCC) 07/30/2019   Vitamin D deficiency 12/13/2014   Perennial allergic rhinitis 12/13/2014   Metabolic syndrome 12/13/2014   Herpes  genitalis in women 12/13/2014   Goiter 12/13/2014   Dyslipidemia 12/13/2014   Hematuria, microscopic 12/13/2014   Hypertension 09/06/2013   Obesity (BMI 30-39.9) 09/06/2013   H/O myomectomy 05/14/2013    Past Surgical History:  Procedure Laterality Date   APPENDECTOMY  2008   CHOLECYSTECTOMY  2016   MYOMECTOMY  12.19.2014   TONSILLECTOMY AND ADENOIDECTOMY N/A 10/15/2015   Procedure: TONSILLECTOMY ;  Surgeon: Bud Face, MD;  [facility];  Service: ENT;  Laterality: N/A;  UPREG    Family History  Problem Relation Age of Onset   Hypertension Mother    Diabetes Mother    COPD Mother    Stroke Mother    Hypertension Father    Heart attack Father    Hypertension Sister    Breast cancer Maternal Aunt    Heart disease Maternal Grandmother    Diabetes Maternal Grandmother    Hypertension Maternal Grandmother    Cancer Maternal Grandfather     Social History   Tobacco Use   Smoking status: Never   Smokeless tobacco: Never  Substance Use Topics   Alcohol use: Yes    Alcohol/week: 0.0 standard drinks of alcohol    Comment: occasionally     Current Outpatient Medications:    cetirizine (ZYRTEC) 10 MG tablet, Take 10 mg by mouth 2 (two) times daily. Am and pm, Disp: , Rfl:    norethindrone-ethinyl estradiol (NORTREL 0.5/35, 28,) 0.5-35 MG-MCG tablet, Take 1 tablet by mouth daily., Disp: 84 tablet, Rfl: 1  QUEtiapine (SEROQUEL) 25 MG tablet, Take 1 tablet (25 mg total) by mouth at bedtime., Disp: 30 tablet, Rfl: 0   triamterene-hydrochlorothiazide (DYAZIDE) 37.5-25 MG capsule, TAKE 1 CAPSULE DAILY, Disp: 90 capsule, Rfl: 0   atenolol (TENORMIN) 50 MG tablet, Take 1 tablet (50 mg total) by mouth daily., Disp: 90 tablet, Rfl: 1   valACYclovir (VALTREX) 500 MG tablet, Take 1 tablet (500 mg total) by mouth 3 (three) times daily. TAKE 1 TABLET 3 TIMES A DAY, Disp: 100 tablet, Rfl: 0  Allergies  Allergen Reactions   Ace Inhibitors     Per ENT , cough     Percocet [Oxycodone-Acetaminophen] Itching and Other (See Comments)    Hot Flashes    I personally reviewed active problem list, medication list, allergies, health maintenance, notes from last encounter, lab results with the patient/caregiver today.   Review of Systems  Constitutional:  Negative for chills and fever.  Eyes:  Negative for blurred vision and double vision.  Respiratory:  Negative for shortness of breath and wheezing.   Cardiovascular:  Positive for leg swelling (usually after being on feet for prolonged periods). Negative for chest pain and palpitations.  Neurological:  Positive for headaches (she gets tension headaches often). Negative for dizziness, tingling, tremors and loss of consciousness.      Objective  Vitals:   03/10/23 1324  BP: 122/80  Pulse: (!) 56  Resp: 16  Temp: 97.8 F (36.6 C)  TempSrc: Oral  SpO2: 100%  Weight: 231 lb 8 oz (105 kg)  Height: 5\' 7"  (1.702 m)    Body mass index is 36.26 kg/m.  Physical Exam Vitals reviewed.  Constitutional:      General: She is awake.     Appearance: Normal appearance. She is well-developed and well-groomed.  HENT:     Head: Normocephalic and atraumatic.  Neck:     Thyroid: No thyroid mass, thyromegaly or thyroid tenderness.  Cardiovascular:     Rate and Rhythm: Normal rate and regular rhythm.     Pulses: Normal pulses.          Radial pulses are 2+ on the right side and 2+ on the left side.     Heart sounds: Normal heart sounds. No murmur heard.    No friction rub. No gallop.  Pulmonary:     Effort: Pulmonary effort is normal.     Breath sounds: Normal breath sounds. No decreased air movement. No decreased breath sounds, wheezing, rhonchi or rales.  Musculoskeletal:     Right lower leg: No edema.     Left lower leg: No edema.  Neurological:     General: No focal deficit present.     Mental Status: She is alert and oriented to person, place, and time.     GCS: GCS eye subscore is 4. GCS verbal  subscore is 5. GCS motor subscore is 6.     Cranial Nerves: No cranial nerve deficit, dysarthria or facial asymmetry.  Psychiatric:        Attention and Perception: Attention and perception normal.        Mood and Affect: Mood and affect normal.        Speech: Speech normal.        Behavior: Behavior normal. Behavior is cooperative.      No results found for this or any previous visit (from the past 2160 hour(s)).   PHQ2/9:    03/10/2023    1:23 PM 09/02/2022   11:56 AM 04/01/2022   11:59  AM 03/02/2022    1:31 PM 08/06/2021   11:46 AM  Depression screen PHQ 2/9  Decreased Interest 0 0 0 0 0  Down, Depressed, Hopeless 0 0 0 0 0  PHQ - 2 Score 0 0 0 0 0  Altered sleeping 0 0 0 3 0  Tired, decreased energy 0 0 0 0 0  Change in appetite 0 0 0 0 0  Feeling bad or failure about yourself  0 0 0 0 0  Trouble concentrating 0 0 0 1 0  Moving slowly or fidgety/restless 0 0 0 0 0  Suicidal thoughts 0 0 0 0 0  PHQ-9 Score 0 0 0 4 0  Difficult doing work/chores Not difficult at all  Not difficult at all  Not difficult at all      Fall Risk:    03/10/2023    1:23 PM 09/02/2022   11:55 AM 04/01/2022   11:59 AM 03/02/2022    1:31 PM 08/06/2021   11:45 AM  Fall Risk   Falls in the past year? 0 0 0 0 0  Number falls in past yr: 0 0 0 0 0  Injury with Fall? 0 0 0 0 0  Risk for fall due to : No Fall Risks No Fall Risks  No Fall Risks No Fall Risks  Follow up Falls prevention discussed;Education provided;Falls evaluation completed Falls prevention discussed  Falls prevention discussed Falls prevention discussed      Functional Status Survey: Is the patient deaf or have difficulty hearing?: No Does the patient have difficulty seeing, even when wearing glasses/contacts?: No Does the patient have difficulty concentrating, remembering, or making decisions?: Yes Does the patient have difficulty walking or climbing stairs?: No Does the patient have difficulty dressing or bathing?: No Does the  patient have difficulty doing errands alone such as visiting a doctor's office or shopping?: No    Assessment & Plan  Problem List Items Addressed This Visit       Cardiovascular and Mediastinum   Hypertension - Primary    Chronic, historic condition Appears well-managed today She is currently taking Triamterene- hydrochlorothiazide 37.5-25 mg PO every day, atenolol 50 mg PO every day and appears to be tolerating well Continue current regimen Recommend that she checks blood pressure at home on a routine basis for monitoring and keep a log to review at follow-up Follow-up in 3 months or sooner if concerns arise      Relevant Medications   atenolol (TENORMIN) 50 MG tablet   Other Relevant Orders   COMPLETE METABOLIC PANEL WITH GFR   CBC w/Diff/Platelet     Endocrine   Goiter    Chronic, ongoing Recheck TSH and T4 today for monitoring Results to dictate further management      Relevant Medications   atenolol (TENORMIN) 50 MG tablet   Other Relevant Orders   TSH   T4     Genitourinary   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 3 times daily as needed to use for outbreaks/flares Refills provided today Follow-up as needed for concerns      Relevant Medications   valACYclovir (VALTREX) 500 MG tablet     Other   Obesity (BMI 30-39.9)    Chronic, ongoing Patient reports interest in trying weight loss injections again as her insurance is changing Recommend that she calls her insurance to check if she is eligible for coverage of these She reports good results previously with  Reginal Lutes so if insurance will cover we will likely start there Recommend dietary changes including smaller portions, healthy options such as baked or grilled proteins, increased vegetables, reduced sugar and carbs as well as increased exercise to assist with weight loss goals Will await patient's confirmation of insurance coverage before initiating  injectables Follow-up in 3 months or sooner if concerns arise      Vitamin D deficiency    Recheck labs today Results to dictate further management      Relevant Orders   Vitamin D (25 hydroxy)   Metabolic syndrome    Patient is no longer taking metformin due to side effects We are waiting to see if her insurance will cover weight loss injectables if so we will start these Recommend follow-up in 3 months or sooner if concerns arise      Relevant Orders   HgB A1c   Dyslipidemia    Chronic, historic condition Recheck lipids today, results to dictate further management Follow-up in 6 months or sooner if concerns arise      Relevant Orders   Lipid Profile     No follow-ups on file.   I, Alyxander Kollmann E Theodor Mustin, PA-C, have reviewed all documentation for this visit. The documentation on 03/10/23 for the exam, diagnosis, procedures, and orders are all accurate and complete.   Jacquelin Hawking, MHS, PA-C [facility]

## 2023-03-10 NOTE — Assessment & Plan Note (Signed)
Patient is no longer taking metformin due to side effects We are waiting to see if her insurance will cover weight loss injectables if so we will start these Recommend follow-up in 3 months or sooner if concerns arise

## 2023-03-10 NOTE — Assessment & Plan Note (Signed)
Chronic, historic condition Appears well-managed today She is currently taking Triamterene- hydrochlorothiazide 37.5-25 mg PO every day, atenolol 50 mg PO every day and appears to be tolerating well Continue current regimen Recommend that she checks blood pressure at home on a routine basis for monitoring and keep a log to review at follow-up Follow-up in 3 months or sooner if concerns arise

## 2023-03-10 NOTE — Assessment & Plan Note (Signed)
Chronic, historic condition Recheck lipids today, results to dictate further management Follow-up in 6 months or sooner if concerns arise

## 2023-03-10 NOTE — Assessment & Plan Note (Signed)
Chronic, ongoing Patient reports interest in trying weight loss injections again as her insurance is changing Recommend that she calls her insurance to check if she is eligible for coverage of these She reports good results previously with Reginal Lutes so if insurance will cover we will likely start there Recommend dietary changes including smaller portions, healthy options such as baked or grilled proteins, increased vegetables, reduced sugar and carbs as well as increased exercise to assist with weight loss goals Will await patient's confirmation of insurance coverage before initiating injectables Follow-up in 3 months or sooner if concerns arise

## 2023-03-11 LAB — COMPLETE METABOLIC PANEL WITH GFR
AG Ratio: 1.1 (calc) (ref 1.0–2.5)
ALT: 13 U/L (ref 6–29)
AST: 14 U/L (ref 10–30)
Albumin: 4.1 g/dL (ref 3.6–5.1)
Alkaline phosphatase (APISO): 44 U/L (ref 31–125)
BUN: 14 mg/dL (ref 7–25)
CO2: 28 mmol/L (ref 20–32)
Calcium: 9.7 mg/dL (ref 8.6–10.2)
Chloride: 101 mmol/L (ref 98–110)
Creat: 0.76 mg/dL (ref 0.50–0.99)
Globulin: 3.7 g/dL (ref 1.9–3.7)
Glucose, Bld: 76 mg/dL (ref 65–99)
Potassium: 4.4 mmol/L (ref 3.5–5.3)
Sodium: 139 mmol/L (ref 135–146)
Total Bilirubin: 0.6 mg/dL (ref 0.2–1.2)
Total Protein: 7.8 g/dL (ref 6.1–8.1)
eGFR: 101 mL/min/{1.73_m2} (ref >=60)

## 2023-03-11 LAB — CBC WITH DIFFERENTIAL/PLATELET
Absolute Monocytes: 423 {cells}/uL (ref 200–950)
Basophils Absolute: 58 {cells}/uL (ref 0–200)
Basophils Relative: 0.7 %
Eosinophils Absolute: 50 {cells}/uL (ref 15–500)
Eosinophils Relative: 0.6 %
HCT: 43.1 % (ref 35.0–45.0)
Hemoglobin: 14.1 g/dL (ref 11.7–15.5)
Lymphs Abs: 2382 {cells}/uL (ref 850–3900)
MCH: 29.4 pg (ref 27.0–33.0)
MCHC: 32.7 g/dL (ref 32.0–36.0)
MCV: 90 fL (ref 80.0–100.0)
MPV: 11.4 fL (ref 7.5–12.5)
Monocytes Relative: 5.1 %
Neutro Abs: 5387 {cells}/uL (ref 1500–7800)
Neutrophils Relative %: 64.9 %
Platelets: 371 10*3/uL (ref 140–400)
RBC: 4.79 10*6/uL (ref 3.80–5.10)
RDW: 11.5 % (ref 11.0–15.0)
Total Lymphocyte: 28.7 %
WBC: 8.3 10*3/uL (ref 3.8–10.8)

## 2023-03-11 LAB — LIPID PANEL
Cholesterol: 229 mg/dL — ABNORMAL HIGH (ref <200)
HDL: 64 mg/dL (ref >=50)
LDL Cholesterol (Calc): 140 mg/dL — ABNORMAL HIGH
Non-HDL Cholesterol (Calc): 165 mg/dL — ABNORMAL HIGH (ref <130)
Total CHOL/HDL Ratio: 3.6 (calc) (ref <5.0)
Triglycerides: 125 mg/dL (ref <150)

## 2023-03-11 LAB — TSH: TSH: 0.69 m[IU]/L

## 2023-03-11 LAB — HEMOGLOBIN A1C
Hgb A1c MFr Bld: 5 %{Hb} (ref <5.7)
Mean Plasma Glucose: 97 mg/dL
eAG (mmol/L): 5.4 mmol/L

## 2023-03-11 LAB — VITAMIN D 25 HYDROXY (VIT D DEFICIENCY, FRACTURES): Vit D, 25-Hydroxy: 51 ng/mL (ref 30–100)

## 2023-03-11 LAB — T4: T4, Total: 10.7 ug/dL (ref 5.1–11.9)

## 2023-03-11 NOTE — Progress Notes (Signed)
Your lab results are back Your cholesterol is elevated and appears to be increased from 1 year ago.  I recommend a trial of diet and exercise to see if we can control this without starting medication.  This would include reducing saturated fats in your daily diet and increasing "good" fats such as those from olive oil, tree nuts, avocado.  I also recommend increasing your daily exercise.  If these measures do not improve your cholesterol over the next 6 months I recommend starting a medication to manage your cholesterol Your electrolytes, liver and kidney function were overall normal at this time Your CBC was overall normal, no signs of anemia Your A1c was 5.0 which is normal Your thyroid testing was normal Your vitamin D was in normal range.  I do not recommend any supplementation at this time Please let us know if you have further questions or concerns

## 2023-03-17 ENCOUNTER — Other Ambulatory Visit: Payer: Self-pay | Admitting: Family Medicine

## 2023-03-17 DIAGNOSIS — Z3041 Encounter for surveillance of contraceptive pills: Secondary | ICD-10-CM

## 2023-03-17 DIAGNOSIS — I10 Essential (primary) hypertension: Secondary | ICD-10-CM

## 2023-03-17 DIAGNOSIS — A6009 Herpesviral infection of other urogenital tract: Secondary | ICD-10-CM

## 2023-03-17 MED ORDER — NORTREL 0.5/35 (28) 0.5-35 MG-MCG PO TABS
1.0000 | ORAL_TABLET | Freq: Every day | ORAL | 0 refills | Status: DC
Start: 2023-03-17 — End: 2023-04-14

## 2023-03-17 MED ORDER — TRIAMTERENE-HCTZ 37.5-25 MG PO CAPS
1.0000 | ORAL_CAPSULE | Freq: Every day | ORAL | 0 refills | Status: DC
Start: 2023-03-17 — End: 2023-07-11

## 2023-03-17 MED ORDER — ATENOLOL 50 MG PO TABS
50.0000 mg | ORAL_TABLET | Freq: Every day | ORAL | 1 refills | Status: DC
Start: 2023-03-17 — End: 2023-09-08

## 2023-03-17 MED ORDER — VALACYCLOVIR HCL 500 MG PO TABS
500.0000 mg | ORAL_TABLET | Freq: Three times a day (TID) | ORAL | 0 refills | Status: DC
Start: 2023-03-17 — End: 2023-12-19

## 2023-03-17 NOTE — Telephone Encounter (Signed)
Pt called in, states she received automated call from [facility] notifying her she has meds ready but on 2 are ready and 3 were sent. Pt is unsure which 2 so she is asking for everything to be sent and added a new rx to refill. Advised I would send all the pharmacy so they can hopefully receive all and fill for her.  Requested Prescriptions  Pending Prescriptions Disp Refills   atenolol (TENORMIN) 50 MG tablet 90 tablet 1    Sig: Take 1 tablet (50 mg total) by mouth daily.     Cardiovascular: Beta Blockers 2 Passed - 03/17/2023  1:43 PM      Passed - Cr in normal range and within 360 days    Creat  Date Value Ref Range Status  03/10/2023 0.76 0.50 - 0.99 mg/dL Final   Creatinine, Urine  Date Value Ref Range Status  09/10/2020 145 20 - 275 mg/dL Final         Passed - Last BP in normal range    BP Readings from Last 1 Encounters:  03/10/23 122/80         Passed - Last Heart Rate in normal range    Pulse Readings from Last 1 Encounters:  03/10/23 (!) 56         Passed - Valid encounter within last 6 months    Recent Outpatient Visits           1 week ago Primary hypertension   [place] [facility] Mecum, Mallow E, PA-C   6 months ago Morbid obesity [facility])   [place] [facility] Alba Cory, MD   11 months ago Well adult exam   [facility] Alba [facility], MD   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 Berniece Salines, FNP       Future Appointments             In 3 weeks Alba Cory, MD [facility]             triamterene-hydrochlorothiazide (DYAZIDE) 37.5-25 MG capsule 90 capsule 0    Sig: Take 1 each (1 capsule total) by mouth daily.     Cardiovascular: Diuretic Combos Passed - 03/17/2023  1:43 PM      Passed - K in normal range  and within 180 days    Potassium  Date Value Ref Range Status  03/10/2023 4.4 3.5 - 5.3 mmol/L Final  02/08/2014 3.8 3.5 - 5.1 mmol/L Final         Passed - Na in normal range and within 180 days    Sodium  Date Value Ref Range Status  03/10/2023 139 135 - 146 mmol/L Final  04/22/2017 140 137 - 147 Final  02/08/2014 140 136 - 145 mmol/L Final         Passed - Cr in normal range and within 180 days    Creat  Date Value Ref Range Status  03/10/2023 0.76 0.50 - 0.99 mg/dL Final   Creatinine, Urine  Date Value Ref Range Status  09/10/2020 145 20 - 275 mg/dL Final         Passed - Last BP in normal range    BP Readings from Last 1 Encounters:  03/10/23 122/80         Passed - Valid encounter  within last 6 months    Recent Outpatient Visits           1 week ago Primary hypertension   [place] [facility] [address] Mecum, Oswaldo Conroy, PA-C   6 months ago Morbid obesity [facility]   [place] [facility] Alba Cory, MD   11 months ago Well adult exam   [facility], MD   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     OB/GYN:  Contraceptives Passed - 03/17/2023  1:43 PM      Passed - Last BP in normal range    BP Readings from Last 1 Encounters:  03/10/23 122/80         Passed - Valid encounter within last 12 months    Recent Outpatient Visits           1 week ago Primary hypertension    [facility] Mecum, Oswaldo Conroy, PA-C   6 months ago Morbid obesity [facility])   [facility] Alba Cory, MD   11 months ago Well adult exam   [facility] Alba Cory, MD   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

## 2023-04-06 NOTE — Progress Notes (Unsigned)
Name: Kelsey Barnes   MRN: [id number]    DOB: [birth year]   Date:04/07/2023       Progress Note  Subjective  Chief Complaint  Annual Exam  HPI  Patient presents for annual CPE.  The 10-year ASCVD risk score (Arnett DK, et al., 2019) is: 1%   Values used to calculate the score:     Age: 42 years     Sex: Female     Is Non-Hispanic African American: Yes     Diabetic: No     Tobacco smoker: No     Systolic Blood Pressure: 122 mmHg     Is BP treated: Yes     HDL Cholesterol: 64 mg/dL     Total Cholesterol: 229 mg/dL   Diet: she needs to resume a healthier diet, cut down on fast food.  Exercise: physical job , discussed stretching , yoga   Last Eye Exam: Up to date Last Dental Exam: Up to date  [employer] Visit from 04/07/2023 in [facility]  AUDIT-C Score 1      Depression: Phq 9 is  negative    04/07/2023   11:55 AM 03/10/2023    1:23 PM 09/02/2022   11:56 AM 04/01/2022   11:59 AM 03/02/2022    1:31 PM  Depression screen PHQ 2/9  Decreased Interest 0 0 0 0 0  Down, Depressed, Hopeless 0 0 0 0 0  PHQ - 2 Score 0 0 0 0 0  Altered sleeping 0 0 0 0 3  Tired, decreased energy 0 0 0 0 0  Change in appetite 0 0 0 0 0  Feeling bad or failure about yourself  0 0 0 0 0  Trouble concentrating 0 0 0 0 1  Moving slowly or fidgety/restless 0 0 0 0 0  Suicidal thoughts 0 0 0 0 0  PHQ-9 Score 0 0 0 0 4  Difficult doing work/chores  Not difficult at all  Not difficult at all    Hypertension: BP Readings from Last 3 Encounters:  04/07/23 122/78  03/10/23 122/80  09/02/22 132/68   Obesity: Wt Readings from Last 3 Encounters:  04/07/23 229 lb 12.8 oz (104.2 kg)  03/10/23 231 lb 8 oz (105 kg)  09/02/22 239 lb (108.4 kg)   BMI Readings from Last 3 Encounters:  04/07/23 34.94 kg/m  03/10/23 36.26 kg/m  09/02/22 37.43 kg/m     Vaccines:    Tdap: up to date Shingrix: N/A Pneumonia: N/A Flu: at work  COVID-19: up to  date   Hep C Screening: 03/02/22 STD testing and prevention (HIV/chl/gon/syphilis): 04/18/15 Intimate partner violence: negative screen  Sexual History : not sexually active  Menstrual History/LMP/Abnormal Bleeding: takes ocp, sometimes skips cycles  Discussed importance of follow up if any post-menopausal bleeding: N/A Incontinence Symptoms: negative for symptoms   Breast cancer:  - Last Mammogram: 10/19/22 - BRCA gene screening: N/A  Osteoporosis Prevention : Discussed high calcium and vitamin D supplementation, weight bearing exercises Bone density: N/A   Cervical cancer screening: 04/01/22  Skin cancer: Discussed monitoring for atypical lesions  Colorectal cancer: N/A   Lung cancer:  Low Dose CT Chest recommended if Age 25-80 years, 20 pack-year currently smoking OR have quit w/in 15years. Patient does not qualify for screen   ECG: 01/20/14  Advanced Care Planning: A voluntary discussion about advance care planning including the explanation and discussion of advance directives.  Discussed health care proxy and Living will, and the patient  was able to identify a health care proxy as sister - Driante .  Patient does not have a living will and power of attorney of health care   Lipids: Lab Results  Component Value Date   CHOL 229 (H) 03/10/2023   CHOL 203 (H) 03/02/2022   CHOL 190 08/06/2021   Lab Results  Component Value Date   HDL 64 03/10/2023   HDL 50 03/02/2022   HDL 56 08/06/2021   Lab Results  Component Value Date   LDLCALC 140 (H) 03/10/2023   LDLCALC 130 (H) 03/02/2022   LDLCALC 112 (H) 08/06/2021   Lab Results  Component Value Date   TRIG 125 03/10/2023   TRIG 124 03/02/2022   TRIG 117 08/06/2021   Lab Results  Component Value Date   CHOLHDL 3.6 03/10/2023   CHOLHDL 4.1 03/02/2022   CHOLHDL 3.4 08/06/2021   No results found for: "LDLDIRECT"  Glucose: Glucose  Date Value Ref Range Status  02/08/2014 87 65 - 99 mg/dL Final  65/78/4696 78 65 - 99  mg/dL Final  29/52/8413 77 65 - 99 mg/dL Final   Glucose, Bld  Date Value Ref Range Status  03/10/2023 76 65 - 99 mg/dL Final    Comment:    .            Fasting reference interval .   03/02/2022 69 65 - 139 mg/dL Final    Comment:    .        Non-fasting reference interval .   08/06/2021 86 65 - 99 mg/dL Final    Comment:    .            Fasting reference interval .     Patient Active Problem List   Diagnosis Date Noted   Menorrhagia with regular cycle 03/03/2021   Uterine leiomyoma 03/03/2021   Morbid obesity (HCC) 07/30/2019   Vitamin D deficiency 12/13/2014   Perennial allergic rhinitis 12/13/2014   Metabolic syndrome 12/13/2014   Herpes genitalis in women 12/13/2014   Goiter 12/13/2014   Dyslipidemia 12/13/2014   Hematuria, microscopic 12/13/2014   Hypertension 09/06/2013   Obesity (BMI 30-39.9) 09/06/2013   H/O myomectomy 05/14/2013    Past Surgical History:  Procedure Laterality Date   APPENDECTOMY  2008   CHOLECYSTECTOMY  2016   MYOMECTOMY  12.19.2014   TONSILLECTOMY AND ADENOIDECTOMY N/A 10/15/2015   Procedure: TONSILLECTOMY ;  Surgeon: Bud Face, MD;  Location: [facility];  Service: ENT;  Laterality: N/A;  UPREG    Family History  Problem Relation Age of Onset   Hypertension Mother    Diabetes Mother    COPD Mother    Stroke Mother    Hypertension Father    Heart attack Father    Hypertension Sister    Breast cancer Maternal Aunt    Heart disease Maternal Grandmother    Diabetes Maternal Grandmother    Hypertension Maternal Grandmother    Cancer Maternal Grandfather     Social History   Socioeconomic History   Marital status: Single    Spouse name: Not on file   Number of children: 0   Years of education: Not on file   Highest education level: Associate degree: academic program  Occupational History   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  Tobacco Use   Smoking status: Never   Smokeless tobacco: Never   Vaping Use   Vaping status: Never Used  Substance and Sexual Activity   Alcohol use: Yes  Alcohol/week: 0.0 standard drinks of alcohol    Comment: occasionally   Drug use: No   Sexual activity: Not Currently    Birth control/protection: Pill  Other Topics Concern   Not on file  Social History Narrative   She works full time at [employer]    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 at [employer], graduated 2022     Social Determinants of Health   Financial Resource Strain: Low Risk  (04/07/2023)   Overall Financial Resource Strain (CARDIA)    Difficulty of Paying Living Expenses: Not hard at all  Food Insecurity: No Food Insecurity (04/07/2023)   Hunger Vital Sign    Worried About Running Out of Food in the Last Year: Never true    Ran Out of Food in the Last Year: Never true  Transportation Needs: No Transportation Needs (04/07/2023)   PRAPARE - Administrator, Civil Service (Medical): No    Lack of Transportation (Non-Medical): No  Physical Activity: Insufficiently Active (04/07/2023)   Exercise Vital Sign    Days of Exercise per Week: 3 days    Minutes of Exercise per Session: 30 min  Stress: Stress Concern Present (04/07/2023)   [employer] of Occupational Health - Occupational Stress Questionnaire    Feeling of Stress : To some extent  Social Connections: Socially Isolated (04/07/2023)   Social Connection and Isolation Panel [NHANES]    Frequency of Communication with Friends and Family: More than three times a week    Frequency of Social Gatherings with Friends and Family: Twice a week    Attends Religious Services: Never    Database administrator or Organizations: No    Attends Banker Meetings: Never    Marital Status: Never married  Intimate Partner Violence: Not At Risk (04/07/2023)   Humiliation, Afraid, Rape, and Kick questionnaire    Fear of Current or Ex-Partner: No    Emotionally Abused: No    Physically Abused: No     Sexually Abused: No     Current Outpatient Medications:    atenolol (TENORMIN) 50 MG tablet, Take 1 tablet (50 mg total) by mouth daily., Disp: 90 tablet, Rfl: 1   cetirizine (ZYRTEC) 10 MG tablet, Take 10 mg by mouth 2 (two) times daily. Am and pm, Disp: , Rfl:    norethindrone-ethinyl estradiol (NORTREL 0.5/35, 28,) 0.5-35 MG-MCG tablet, Take 1 tablet by mouth daily., Disp: 84 tablet, Rfl: 0   QUEtiapine (SEROQUEL) 25 MG tablet, Take 1 tablet (25 mg total) by mouth at bedtime., Disp: 30 tablet, Rfl: 0   triamterene-hydrochlorothiazide (DYAZIDE) 37.5-25 MG capsule, Take 1 each (1 capsule total) by mouth daily., Disp: 90 capsule, Rfl: 0   valACYclovir (VALTREX) 500 MG tablet, Take 1 tablet (500 mg total) by mouth 3 (three) times daily. TAKE 1 TABLET 3 TIMES A DAY, Disp: 100 tablet, Rfl: 0  Allergies  Allergen Reactions   Ace Inhibitors     Per ENT , cough    Percocet [Oxycodone-Acetaminophen] Itching and Other (See Comments)    Hot Flashes     ROS  Constitutional: Negative for fever , positive for weight change.  Respiratory: Negative for cough and shortness of breath.   Cardiovascular: Negative for chest pain or palpitations.  Gastrointestinal: Negative for abdominal pain, no bowel changes.  Musculoskeletal: Negative for gait problem or joint swelling.  Skin: Negative for rash.  Neurological: Negative for dizziness or headache.  No other specific complaints in a  complete review of systems (except as listed in HPI above).   Objective  Vitals:   04/07/23 1157  BP: 122/78  Pulse: 70  Resp: 16  Temp: 97.7 F (36.5 C)  TempSrc: Oral  SpO2: 98%  Weight: 229 lb 12.8 oz (104.2 kg)  Height: 5\' 8"  (1.727 m)    Body mass index is 34.94 kg/m.  Physical Exam  Constitutional: Patient appears well-developed and well-nourished. No distress.  HENT: Head: Normocephalic and atraumatic. Ears: B TMs ok, no erythema or effusion; Nose: Nose normal. Mouth/Throat: Oropharynx is clear  and moist. No oropharyngeal exudate.  Eyes: Conjunctivae and EOM are normal. Pupils are equal, round, and reactive to light. No scleral icterus.  Neck: Normal range of motion. Neck supple. No JVD present. No thyromegaly present.  Cardiovascular: Normal rate, regular rhythm and normal heart sounds.  No murmur heard. No BLE edema. Pulmonary/Chest: Effort normal and breath sounds normal. No respiratory distress. Abdominal: Soft. Bowel sounds are normal, no distension. There is no tenderness. no masses Breast: no lumps or masses, no nipple discharge or rashes FEMALE GENITALIA:  Not done  RECTAL: not done  Musculoskeletal: Normal range of motion, no joint effusions. No gross deformities Neurological: he is alert and oriented to person, place, and time. No cranial nerve deficit. Coordination, balance, strength, speech and gait are normal.  Skin: Skin is warm and dry. No rash noted. No erythema.  Psychiatric: Patient has a normal mood and affect. behavior is normal. Judgment and thought content normal.   Recent Results (from the past 2160 hour(s))  COMPLETE METABOLIC PANEL WITH GFR     Status: None   Collection Time: 03/10/23  2:15 PM  Result Value Ref Range   Glucose, Bld 76 65 - 99 mg/dL    Comment: .            Fasting reference interval .    BUN 14 7 - 25 mg/dL   Creat 2.20 2.54 - 2.70 mg/dL   eGFR 623 > OR = 60 JS/EGB/1.51V6   BUN/Creatinine Ratio SEE NOTE: 6 - 22 (calc)    Comment:    Not Reported: BUN and Creatinine are within    reference range. .    Sodium 139 135 - 146 mmol/L   Potassium 4.4 3.5 - 5.3 mmol/L   Chloride 101 98 - 110 mmol/L   CO2 28 20 - 32 mmol/L   Calcium 9.7 8.6 - 10.2 mg/dL   Total Protein 7.8 6.1 - 8.1 g/dL   Albumin 4.1 3.6 - 5.1 g/dL   Globulin 3.7 1.9 - 3.7 g/dL (calc)   AG Ratio 1.1 1.0 - 2.5 (calc)   Total Bilirubin 0.6 0.2 - 1.2 mg/dL   Alkaline phosphatase (APISO) 44 31 - 125 U/L   AST 14 10 - 30 U/L   ALT 13 6 - 29 U/L  CBC w/Diff/Platelet      Status: None   Collection Time: 03/10/23  2:15 PM  Result Value Ref Range   WBC 8.3 3.8 - 10.8 Thousand/uL   RBC 4.79 3.80 - 5.10 Million/uL   Hemoglobin 14.1 11.7 - 15.5 g/dL   HCT 16.0 73.7 - 10.6 %   MCV 90.0 80.0 - 100.0 fL   MCH 29.4 27.0 - 33.0 pg   MCHC 32.7 32.0 - 36.0 g/dL    Comment: For adults, a slight decrease in the calculated MCHC value (in the range of 30 to 32 g/dL) is most likely not clinically significant; however, it should be interpreted  with caution in correlation with other red cell parameters and the patient's clinical condition.    RDW 11.5 11.0 - 15.0 %   Platelets 371 140 - 400 Thousand/uL   MPV 11.4 7.5 - 12.5 fL   Neutro Abs 5,387 1,500 - 7,800 cells/uL   Lymphs Abs 2,382 850 - 3,900 cells/uL   Absolute Monocytes 423 200 - 950 cells/uL   Eosinophils Absolute 50 15 - 500 cells/uL   Basophils Absolute 58 0 - 200 cells/uL   Neutrophils Relative % 64.9 %   Total Lymphocyte 28.7 %   Monocytes Relative 5.1 %   Eosinophils Relative 0.6 %   Basophils Relative 0.7 %  Lipid Profile     Status: Abnormal   Collection Time: 03/10/23  2:15 PM  Result Value Ref Range   Cholesterol 229 (H) <200 mg/dL   HDL 64 > OR = 50 mg/dL   Triglycerides 132 <440 mg/dL   LDL Cholesterol (Calc) 140 (H) mg/dL (calc)    Comment: Reference range: <100 . Desirable range <100 mg/dL for primary prevention;   <70 mg/dL for patients with CHD or diabetic patients  with > or = 2 CHD risk factors. Marland Kitchen LDL-C is now calculated using the Martin-Hopkins  calculation, which is a validated novel method providing  better accuracy than the Friedewald equation in the  estimation of LDL-C.  Horald Pollen et al. Lenox Ahr. 1027;253(66): 2061-2068  (http://education.[facility].com/faq/FAQ164)    Total CHOL/HDL Ratio 3.6 <5.0 (calc)   Non-HDL Cholesterol (Calc) 165 (H) <130 mg/dL (calc)    Comment: For patients with diabetes plus 1 major ASCVD risk  factor, treating to a non-HDL-C goal of  <100 mg/dL  (LDL-C of <44 mg/dL) is considered a therapeutic  option.   HgB A1c     Status: None   Collection Time: 03/10/23  2:15 PM  Result Value Ref Range   Hgb A1c MFr Bld 5.0 <5.7 % of total Hgb    Comment: For the purpose of screening for the presence of diabetes: . <5.7%       Consistent with the absence of diabetes 5.7-6.4%    Consistent with increased risk for diabetes             (prediabetes) > or =6.5%  Consistent with diabetes . This assay result is consistent with a decreased risk of diabetes. . Currently, no consensus exists regarding use of hemoglobin A1c for diagnosis of diabetes in children. . According to American Diabetes Association (ADA) guidelines, hemoglobin A1c <7.0% represents optimal control in non-pregnant diabetic patients. Different metrics may apply to specific patient populations.  Standards of Medical Care in Diabetes(ADA). .    Mean Plasma Glucose 97 mg/dL   eAG (mmol/L) 5.4 mmol/L  TSH     Status: None   Collection Time: 03/10/23  2:15 PM  Result Value Ref Range   TSH 0.69 mIU/L    Comment:           Reference Range .           > or = 20 Years  0.40-4.50 .                Pregnancy Ranges           First trimester    0.26-2.66           Second trimester   0.55-2.73           Third trimester    0.43-2.91   T4     Status: None  Collection Time: 03/10/23  2:15 PM  Result Value Ref Range   T4, Total 10.7 5.1 - 11.9 mcg/dL  Vitamin D (25 hydroxy)     Status: None   Collection Time: 03/10/23  2:15 PM  Result Value Ref Range   Vit D, 25-Hydroxy 51 30 - 100 ng/mL    Comment: Vitamin D Status         25-OH Vitamin D: . Deficiency:                    <20 ng/mL Insufficiency:             20 - 29 ng/mL Optimal:                 > or = 30 ng/mL . For 25-OH Vitamin D testing on patients on  D2-supplementation and patients for whom quantitation  of D2 and D3 fractions is required, the [facility]ureD(TM) 25-OH VIT D, (D2,D3), LC/MS/MS is  recommended: order  code 40981 (patients >64yrs). . See Note 1 . Note 1 . For additional information, please refer to  http://education.[facility].com/faq/FAQ199  (This link is being provided for informational/ educational purposes only.)      Fall Risk:    04/07/2023   11:54 AM 03/10/2023    1:23 PM 09/02/2022   11:55 AM 04/01/2022   11:59 AM 03/02/2022    1:31 PM  Fall Risk   Falls in the past year? 0 0 0 0 0  Number falls in past yr:  0 0 0 0  Injury with Fall?  0 0 0 0  Risk for fall due to : No Fall Risks No Fall Risks No Fall Risks  No Fall Risks  Follow up Falls prevention discussed Falls prevention discussed;Education provided;Falls evaluation completed Falls prevention discussed  Falls prevention discussed     Functional Status Survey: Is the patient deaf or have difficulty hearing?: No Does the patient have difficulty seeing, even when wearing glasses/contacts?: No Does the patient have difficulty concentrating, remembering, or making decisions?: Yes Does the patient have difficulty walking or climbing stairs?: No Does the patient have difficulty dressing or bathing?: No Does the patient have difficulty doing errands alone such as visiting a doctor's office or shopping?: No   Assessment & Plan  1. Well adult exam   Flu is up to date   2. Abnormal mammogram of right breast  - [place] LIMITED ULTRASOUND INCLUDING AXILLA RIGHT BREAST; Future -USPSTF grade A and B recommendations reviewed with patient;   age-appropriate recommendations, preventive care, screening tests, etc discussed and encouraged; healthy living encouraged; see AVS for patient education given to patient -Discussed importance of 150 minutes of physical activity weekly, eat two servings of fish weekly, eat one serving of tree nuts ( cashews, pistachios, pecans, almonds.Marland Kitchen) every other day, eat 6 servings of fruit/vegetables daily and drink plenty of water and avoid sweet beverages.   -Reviewed  Health Maintenance: Yes.

## 2023-04-07 ENCOUNTER — Ambulatory Visit (INDEPENDENT_AMBULATORY_CARE_PROVIDER_SITE_OTHER): Payer: Managed Care, Other (non HMO) | Admitting: Family Medicine

## 2023-04-07 ENCOUNTER — Encounter: Payer: Self-pay | Admitting: Family Medicine

## 2023-04-07 VITALS — BP 122/78 | HR 70 | Temp 97.7°F | Resp 16 | Ht 68.0 in | Wt 229.8 lb

## 2023-04-07 DIAGNOSIS — Z0000 Encounter for general adult medical examination without abnormal findings: Secondary | ICD-10-CM | POA: Diagnosis not present

## 2023-04-07 DIAGNOSIS — Z23 Encounter for immunization: Secondary | ICD-10-CM

## 2023-04-07 DIAGNOSIS — R928 Other abnormal and inconclusive findings on diagnostic imaging of breast: Secondary | ICD-10-CM

## 2023-04-13 ENCOUNTER — Other Ambulatory Visit: Payer: Self-pay | Admitting: Family Medicine

## 2023-04-13 DIAGNOSIS — Z3041 Encounter for surveillance of contraceptive pills: Secondary | ICD-10-CM

## 2023-07-08 ENCOUNTER — Other Ambulatory Visit: Payer: Self-pay | Admitting: Family Medicine

## 2023-07-08 DIAGNOSIS — I10 Essential (primary) hypertension: Secondary | ICD-10-CM

## 2023-07-11 NOTE — Telephone Encounter (Signed)
 Requested Prescriptions  Pending Prescriptions Disp Refills   triamterene -hydrochlorothiazide  (DYAZIDE) 37.5-25 MG capsule [Pharmacy Med Name: TRIAMTERENE  37.5MG / HCTZ 25MG  CAPS] 90 capsule 0    Sig: TAKE 1 CAPSULE BY MOUTH ONCE DAILY     Cardiovascular: Diuretic Combos Passed - 07/11/2023 11:16 AM      Passed - K in normal range and within 180 days    Potassium  Date Value Ref Range Status  03/10/2023 4.4 3.5 - 5.3 mmol/L Final  02/08/2014 3.8 3.5 - 5.1 mmol/L Final         Passed - Na in normal range and within 180 days    Sodium  Date Value Ref Range Status  03/10/2023 139 135 - 146 mmol/L Final  04/22/2017 140 137 - 147 Final  02/08/2014 140 136 - 145 mmol/L Final         Passed - Cr in normal range and within 180 days    Creat  Date Value Ref Range Status  03/10/2023 0.76 0.50 - 0.99 mg/dL Final   Creatinine, Urine  Date Value Ref Range Status  09/10/2020 145 20 - 275 mg/dL Final         Passed - Last BP in normal range    BP Readings from Last 1 Encounters:  04/07/23 122/78         Passed - Valid encounter within last 6 months    Recent Outpatient Visits           3 months ago Well adult exam   [facility] Arleen Lacer, MD   4 months ago Primary hypertension   [place] [facility] Mecum, Erin E, PA-C   10 months ago Morbid obesity [facility])   [place] 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

## 2023-09-08 ENCOUNTER — Ambulatory Visit (INDEPENDENT_AMBULATORY_CARE_PROVIDER_SITE_OTHER): Payer: Self-pay | Admitting: Family Medicine

## 2023-09-08 ENCOUNTER — Ambulatory Visit: Payer: Managed Care, Other (non HMO) | Admitting: Family Medicine

## 2023-09-08 ENCOUNTER — Encounter: Payer: Self-pay | Admitting: Family Medicine

## 2023-09-08 VITALS — BP 128/82 | HR 83 | Resp 16 | Ht 68.0 in | Wt 241.8 lb

## 2023-09-08 DIAGNOSIS — E785 Hyperlipidemia, unspecified: Secondary | ICD-10-CM

## 2023-09-08 DIAGNOSIS — G4726 Circadian rhythm sleep disorder, shift work type: Secondary | ICD-10-CM | POA: Insufficient documentation

## 2023-09-08 DIAGNOSIS — I10 Essential (primary) hypertension: Secondary | ICD-10-CM

## 2023-09-08 DIAGNOSIS — E8881 Metabolic syndrome: Secondary | ICD-10-CM

## 2023-09-08 DIAGNOSIS — Z6836 Body mass index (BMI) 36.0-36.9, adult: Secondary | ICD-10-CM

## 2023-09-08 MED ORDER — TRIAMTERENE-HCTZ 37.5-25 MG PO CAPS: 1.0000 | ORAL_CAPSULE | Freq: Every day | ORAL | 1 refills | Status: DC

## 2023-09-08 MED ORDER — ATENOLOL 50 MG PO TABS: 50.0000 mg | ORAL_TABLET | Freq: Every day | ORAL | 1 refills | Status: DC

## 2023-09-08 MED ORDER — QUETIAPINE FUMARATE 25 MG PO TABS
25.0000 mg | ORAL_TABLET | Freq: Every day | ORAL | 0 refills | Status: DC
Start: 2023-09-08 — End: 2023-12-08

## 2023-09-08 MED ORDER — MODAFINIL 100 MG PO TABS
100.0000 mg | ORAL_TABLET | Freq: Every day | ORAL | 0 refills | Status: AC
Start: 2023-09-08 — End: ?

## 2023-09-08 NOTE — Progress Notes (Signed)
 Name: Kelsey Barnes   MRN: [id number]    DOB: 11-13-1980   Date:09/08/2023       Progress Note  Subjective  Chief Complaint  Chief Complaint  Patient presents with   Medical Management of Chronic Issues   Discussed the use of AI scribe software for clinical note transcription with the patient, who gave verbal consent to proceed.  History of Present Illness Kelsey Barnes is a 43 year old female with obesity, prediabetes, and dyslipidemia who presents for a follow-up visit.  She has experienced a weight increase from 230 lbs to 241 lbs since her last visit, with a BMI now over 35 with co-morbidities such as HTN and hyperlipidemia ( morbid obesity) . She attributes this weight gain to frequent dining out, which she acknowledges is high in calories. She has initiated a weight loss program at work and is taking  Zepbound, starting with a 2.5 mg dose last month, and reports her starting weight at the clinic was approximately 248 lbs. She denies side effects of medication.  Her blood pressure is managed with triamterene/HCTZ 37.5/25 mg once daily and atenolol 50 mg once daily. No chest pain, palpitations, or shortness of breath. She mentions occasional tension-like headaches, which she associates with poor sleep due to her shift work schedule.  Her cholesterol levels were previously noted to be high, with a bad cholesterol level of 140. She is not currently on cholesterol medication  She has a history of sleep disturbances related to her shift work. She previously used Seroquel for sleep but has not taken it recently. She recalls being prescribed trazodone last year but is unsure if she tried it. She experiences drowsiness at inappropriate times, particularly when she needs to be awake, and attributes this to her body's attempt to adjust to her shift work schedule. She has not used any medication to stay awake but wants to manage her sleep and wakefulness better.    Patient Active Problem  List   Diagnosis Date Noted   Menorrhagia with regular cycle 03/03/2021   Uterine leiomyoma 03/03/2021   Morbid obesity (HCC) 07/30/2019   Vitamin D deficiency 12/13/2014   Perennial allergic rhinitis 12/13/2014   Metabolic syndrome 12/13/2014   Herpes genitalis in women 12/13/2014   Goiter 12/13/2014   Dyslipidemia 12/13/2014   Hematuria, microscopic 12/13/2014   Hypertension 09/06/2013   Obesity (BMI 30-39.9) 09/06/2013   H/O myomectomy 05/14/2013    Past Surgical History:  Procedure Laterality Date   APPENDECTOMY  2008   CHOLECYSTECTOMY  2016   MYOMECTOMY  12.19.2014   TONSILLECTOMY AND ADENOIDECTOMY N/A 10/15/2015   Procedure: TONSILLECTOMY ;  Surgeon: Bud Face, MD;  Location: [facility];  Service: ENT;  Laterality: N/A;  UPREG    Family History  Problem Relation Age of Onset   Hypertension Mother    Diabetes Mother    COPD Mother    Stroke Mother    Hypertension Father    Heart attack Father    Hypertension Sister    Breast cancer Maternal Aunt    Heart disease Maternal Grandmother    Diabetes Maternal Grandmother    Hypertension Maternal Grandmother    Cancer Maternal Grandfather     Social History   Tobacco Use   Smoking status: Never   Smokeless tobacco: Never  Substance Use Topics   Alcohol use: Yes    Alcohol/week: 0.0 standard drinks of alcohol    Comment: occasionally     Current Outpatient Medications:  atenolol (TENORMIN) 50 MG tablet, Take 1 tablet (50 mg total) by mouth daily., Disp: 90 tablet, Rfl: 1   cetirizine (ZYRTEC) 10 MG tablet, Take 10 mg by mouth 2 (two) times daily. Am and pm, Disp: , Rfl:    norethindrone-ethinyl estradiol (NORTREL 0.5/35, 28,) 0.5-35 MG-MCG tablet, TAKE 1 TABLET BY MOUTH DAILY, Disp: 84 tablet, Rfl: 3   QUEtiapine (SEROQUEL) 25 MG tablet, Take 1 tablet (25 mg total) by mouth at bedtime., Disp: 30 tablet, Rfl: 0   SAW PALMETTO, SERENOA REPENS, PO, Take by mouth., Disp: , Rfl:     triamterene-hydrochlorothiazide (DYAZIDE) 37.5-25 MG capsule, TAKE 1 CAPSULE BY MOUTH ONCE DAILY, Disp: 90 capsule, Rfl: 0   valACYclovir (VALTREX) 500 MG tablet, Take 1 tablet (500 mg total) by mouth 3 (three) times daily. TAKE 1 TABLET 3 TIMES A DAY, Disp: 100 tablet, Rfl: 0   ZEPBOUND 2.5 MG/0.5ML Pen, Inject 2.5 mg into the skin once a week., Disp: , Rfl:    tirzepatide (ZEPBOUND) 5 MG/0.5ML Pen, Inject 5 mg into the skin once a week. (Patient not taking: Reported on 09/08/2023), Disp: , Rfl:   Allergies  Allergen Reactions   Ace Inhibitors     Per ENT , cough    Percocet [Oxycodone-Acetaminophen] Itching and Other (See Comments)    Hot Flashes    I personally reviewed active problem list, medication list, allergies with the patient/caregiver today.   ROS  Ten systems reviewed and is negative except as mentioned in HPI    Objective Physical Exam  CONSTITUTIONAL: Patient appears well-developed and well-nourished. No distress. HEENT: Head atraumatic, normocephalic, neck supple. CARDIOVASCULAR: Normal rate, regular rhythm and normal heart sounds. No murmur heard. No BLE edema. PULMONARY: Effort normal and breath sounds normal. No respiratory distress. MUSCULOSKELETAL: Normal gait. Without gross motor or sensory deficit. PSYCHIATRIC: Patient has a normal mood and affect. Behavior is normal. Judgment and thought content normal.  Vitals:   09/08/23 0956  BP: 128/82  Pulse: 83  Resp: 16  SpO2: 99%  Weight: 241 lb 12.8 oz (109.7 kg)  Height: 5\' 8"  (1.727 m)    Body mass index is 36.77 kg/m.    PHQ2/9:    09/08/2023    9:53 AM 04/07/2023   11:55 AM 03/10/2023    1:23 PM 09/02/2022   11:56 AM 04/01/2022   11:59 AM  Depression screen PHQ 2/9  Decreased Interest 0 0 0 0 0  Down, Depressed, Hopeless 0 0 0 0 0  PHQ - 2 Score 0 0 0 0 0  Altered sleeping 0 0 0 0 0  Tired, decreased energy 0 0 0 0 0  Change in appetite 0 0 0 0 0  Feeling bad or failure about yourself  0 0 0 0  0  Trouble concentrating 0 0 0 0 0  Moving slowly or fidgety/restless 0 0 0 0 0  Suicidal thoughts 0 0 0 0 0  PHQ-9 Score 0 0 0 0 0  Difficult doing work/chores Not difficult at all  Not difficult at all  Not difficult at all    phq 9 is negative  Fall Risk:    04/07/2023   11:54 AM 03/10/2023    1:23 PM 09/02/2022   11:55 AM 04/01/2022   11:59 AM 03/02/2022    1:31 PM  Fall Risk   Falls in the past year? 0 0 0 0 0  Number falls in past yr:  0 0 0 0  Injury with Fall?  0  0 0 0  Risk for fall due to : No Fall Risks No Fall Risks No Fall Risks  No Fall Risks  Follow up Falls prevention discussed Falls prevention discussed;Education provided;Falls evaluation completed Falls prevention discussed  Falls prevention discussed     Assessment & Plan Morbid Obesity BMI >35, weight increased to 241 lbs. Enrolled in weight loss program, started Zepbound. Weight loss critical for improving hypertension, hyperlipidemia, and metabolic syndrome. - Continue Zepbound. - Encourage dietary modifications: protein, vegetables, fruits; avoid rice, pasta, bread. - Monitor weight and BMI.  Metabolic Syndrome Prediabetes Weight loss and Zepbound expected to improve A1c and cholesterol. - Continue weight loss program with Zepbound. - Monitor A1c and cholesterol levels.  Hypertension Blood pressure controlled at 128/82 mmHg with triamterene/HCTZ and atenolol. Weight loss may further improve control. - Continue triamterene/HCTZ 37.5/25 mg daily. - Continue atenolol 50 mg daily. - Monitor blood pressure.  Sleep Disturbance/Shift work sleep disorder Sleep issues due to shift work affect weight and metabolic health. Trazodone previously prescribed but not used recently. - we will send rx seroquel to take before bed time -Modafinil before work , monitor bp   Dyslipidemia -discussed life style modifications

## 2023-10-16 ENCOUNTER — Other Ambulatory Visit: Payer: Self-pay | Admitting: Family Medicine

## 2023-10-16 DIAGNOSIS — I10 Essential (primary) hypertension: Secondary | ICD-10-CM

## 2023-11-07 ENCOUNTER — Other Ambulatory Visit: Payer: Self-pay | Admitting: Family Medicine

## 2023-11-07 DIAGNOSIS — N63 Unspecified lump in unspecified breast: Secondary | ICD-10-CM

## 2023-12-08 ENCOUNTER — Other Ambulatory Visit: Payer: Self-pay | Admitting: Family Medicine

## 2023-12-08 DIAGNOSIS — G4726 Circadian rhythm sleep disorder, shift work type: Secondary | ICD-10-CM

## 2023-12-08 MED ORDER — QUETIAPINE FUMARATE 25 MG PO TABS: 25.0000 mg | ORAL_TABLET | Freq: Every day | ORAL | 0 refills | Status: DC

## 2023-12-08 NOTE — Addendum Note (Signed)
 Addended by: GLENARD MIRE F on: 12/08/2023 08:40 AM   Modules accepted: Orders

## 2023-12-09 ENCOUNTER — Ambulatory Visit
Admission: RE | Admit: 2023-12-09 | Discharge: 2023-12-09 | Disposition: A | Discharge status: Home / Self Care | Payer: Self-pay | Source: Ambulatory Visit | Attending: Family Medicine | Admitting: Family Medicine

## 2023-12-09 DIAGNOSIS — N63 Unspecified lump in unspecified breast: Secondary | ICD-10-CM | POA: Insufficient documentation

## 2023-12-19 ENCOUNTER — Other Ambulatory Visit: Payer: Self-pay | Admitting: Family Medicine

## 2023-12-19 DIAGNOSIS — A6009 Herpesviral infection of other urogenital tract: Secondary | ICD-10-CM

## 2024-03-09 ENCOUNTER — Ambulatory Visit: Payer: Self-pay | Admitting: Family Medicine

## 2024-03-09 ENCOUNTER — Encounter: Payer: Self-pay | Admitting: Family Medicine

## 2024-03-09 VITALS — BP 126/78 | HR 84 | Resp 16 | Ht 68.0 in | Wt 228.3 lb

## 2024-03-09 DIAGNOSIS — G4726 Circadian rhythm sleep disorder, shift work type: Secondary | ICD-10-CM

## 2024-03-09 DIAGNOSIS — E559 Vitamin D deficiency, unspecified: Secondary | ICD-10-CM | POA: Diagnosis not present

## 2024-03-09 DIAGNOSIS — E049 Nontoxic goiter, unspecified: Secondary | ICD-10-CM

## 2024-03-09 DIAGNOSIS — E8881 Metabolic syndrome: Secondary | ICD-10-CM | POA: Diagnosis not present

## 2024-03-09 DIAGNOSIS — E66811 Obesity, class 1: Secondary | ICD-10-CM | POA: Diagnosis not present

## 2024-03-09 DIAGNOSIS — E785 Hyperlipidemia, unspecified: Secondary | ICD-10-CM

## 2024-03-09 DIAGNOSIS — I10 Essential (primary) hypertension: Secondary | ICD-10-CM | POA: Diagnosis not present

## 2024-03-09 MED ORDER — ATENOLOL 50 MG PO TABS
50.0000 mg | ORAL_TABLET | Freq: Every day | ORAL | 1 refills | Status: AC
Start: 2024-03-09 — End: ?

## 2024-03-09 MED ORDER — TRIAMTERENE-HCTZ 37.5-25 MG PO CAPS
1.0000 | ORAL_CAPSULE | Freq: Every day | ORAL | 1 refills | Status: AC
Start: 2024-03-09 — End: ?

## 2024-03-09 NOTE — Progress Notes (Signed)
 Name: Kelsey Barnes   MRN: [id number]    DOB: December 22, 1980   Date:03/09/2024       Progress Note  Subjective  Chief Complaint  Chief Complaint  Patient presents with   Medical Management of Chronic Issues   Discussed the use of AI scribe software for clinical note transcription with the patient, who gave verbal consent to proceed.  History of Present Illness Kelsey Barnes is a 43 year old female with obesity and hypertension who presents for a follow-up visit.  She is currently undergoing treatment for obesity and is participating in a weight management program through [employer]. She is taking Zepbound at a dose of 10 mg and is awaiting authorization to increase to 12.5 mg. Her body mass index has improved, and her weight has decreased from 241 lbs in April to 228 lbs currently. She is working with a Health and safety inspector and is exploring options for pre-made meals to better manage her diet due to her busy lifestyle and living alone.  She has a history of hypertension and is currently taking atenolol  and triamterene  HCTZ. She feels her blood pressure may be higher at night. No chest pain or palpitations.  She has a history of a goiter, which was previously evaluated with an ultrasound in 2014, showing the right side larger than the left but otherwise normal. No difficulty swallowing or significant hair loss, although she mentions some hair changes possibly related to weight loss or hormonal factors.  She has a history of shift work sleep disorder but has not needed to use modafinil  recently as her sleep has improved. She was prescribed quetiapine  for sleep but has not taken it due to concerns about drowsiness and her irregular sleep schedule.  Her recent lab work in [place]  included an A1c, which was normal, and liver function tests. She is interested in checking her vitamin D  and cholesterol levels, as well as her thyroid function due to the presence of a goiter.    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   Menorrhagia with regular cycle 03/03/2021   Uterine leiomyoma 03/03/2021   Morbid obesity (HCC) 07/30/2019   Vitamin D  deficiency 12/13/2014   Perennial allergic rhinitis 12/13/2014   Metabolic syndrome 12/13/2014   Herpes genitalis in women 12/13/2014   Goiter 12/13/2014   Dyslipidemia 12/13/2014   Hematuria, microscopic 12/13/2014   Hypertension 09/06/2013   Obesity (BMI 30-39.9) 09/06/2013   H/O myomectomy 05/14/2013    Past Surgical History:  Procedure Laterality Date   APPENDECTOMY  2008   CHOLECYSTECTOMY  2016   MYOMECTOMY  12.19.2014   TONSILLECTOMY AND ADENOIDECTOMY N/A 10/15/2015   Procedure: TONSILLECTOMY ;  Surgeon: Carolee Hunter, MD;  Location: [facility];  Service: ENT;  Laterality: N/A;  UPREG    Family History  Problem Relation Age of Onset   Hypertension Mother    Diabetes Mother    COPD Mother    Stroke Mother    Hypertension Father    Heart attack Father    Hypertension Sister    Breast cancer Maternal Aunt    Heart disease Maternal Grandmother    Diabetes Maternal Grandmother    Hypertension Maternal Grandmother    Cancer Maternal Grandfather     Social History   Tobacco Use   Smoking status: Never   Smokeless tobacco: Never  Substance Use Topics   Alcohol use: Yes    Alcohol/week: 0.0 standard drinks of alcohol  Comment: occasionally     Current Outpatient Medications:    cetirizine (ZYRTEC) 10 MG tablet, Take 10 mg by mouth 2 (two) times daily. Am and pm, Disp: , Rfl:    modafinil  (PROVIGIL ) 100 MG tablet, Take 1 tablet (100 mg total) by mouth daily., Disp: 90 tablet, Rfl: 0   norethindrone -ethinyl estradiol  (NORTREL  0.5/35, 28,) 0.5-35 MG-MCG tablet, TAKE 1 TABLET BY MOUTH DAILY, Disp: 84 tablet, Rfl: 3   SAW PALMETTO, SERENOA REPENS, PO, Take by mouth., Disp: , Rfl:    tirzepatide (ZEPBOUND) 12.5 MG/0.5ML Pen, Inject 12.5 mg into the skin once a week.,  Disp: , Rfl:    valACYclovir  (VALTREX ) 500 MG tablet, TAKE 1 TABLET(500 MG) BY MOUTH THREE TIMES DAILY, Disp: 100 tablet, Rfl: 0   atenolol  (TENORMIN ) 50 MG tablet, Take 1 tablet (50 mg total) by mouth daily., Disp: 90 tablet, Rfl: 1   triamterene -hydrochlorothiazide  (DYAZIDE) 37.5-25 MG capsule, Take 1 each (1 capsule total) by mouth daily., Disp: 90 capsule, Rfl: 1  Allergies  Allergen Reactions   Ace Inhibitors     Per ENT , cough    Percocet [Oxycodone -Acetaminophen] Itching and Other (See Comments)    Hot Flashes    I personally reviewed active problem list, medication list, allergies with the patient/caregiver today.   ROS  Ten systems reviewed and is negative except as mentioned in HPI    Objective Physical Exam  CONSTITUTIONAL: Patient appears well-developed and well-nourished. No distress. HEENT: Head atraumatic, normocephalic, neck supple. Thyroid normal. CARDIOVASCULAR: Normal rate, regular rhythm and normal heart sounds. No murmur heard. No BLE edema. PULMONARY: Effort normal and breath sounds normal. No respiratory distress. ABDOMINAL: There is no tenderness or distention. MUSCULOSKELETAL: Normal gait. Without gross motor or sensory deficit. PSYCHIATRIC: Patient has a normal mood and affect. Behavior is normal. Judgment and thought content normal.  Vitals:   03/09/24 1102  BP: 126/78  Pulse: 84  Resp: 16  SpO2: 99%  Weight: 228 lb 4.8 oz (103.6 kg)  Height: 5' 8 (1.727 m)    Body mass index is 34.71 kg/m.    PHQ2/9:    03/09/2024   10:59 AM 09/08/2023    9:53 AM 04/07/2023   11:55 AM 03/10/2023    1:23 PM 09/02/2022   11:56 AM  Depression screen PHQ 2/9  Decreased Interest 0 0 0 0 0  Down, Depressed, Hopeless 0 0 0 0 0  PHQ - 2 Score 0 0 0 0 0  Altered sleeping  0 0 0 0  Tired, decreased energy  0 0 0 0  Change in appetite  0 0 0 0  Feeling bad or failure about yourself   0 0 0 0  Trouble concentrating  0 0 0 0  Moving slowly or  fidgety/restless  0 0 0 0  Suicidal thoughts  0 0 0 0  PHQ-9 Score  0 0 0 0  Difficult doing work/chores  Not difficult at all  Not difficult at all     phq 9 is negative  Fall Risk:    03/09/2024   10:58 AM 04/07/2023   11:54 AM 03/10/2023    1:23 PM 09/02/2022   11:55 AM 04/01/2022   11:59 AM  Fall Risk   Falls in the past year? 0 0 0 0 0  Number falls in past yr: 0  0 0 0  Injury with Fall? 0  0 0 0  Risk for fall due to : No Fall Risks No Fall Risks No  Fall Risks No Fall Risks   Follow up Falls evaluation completed Falls prevention discussed Falls prevention discussed;Education provided;Falls evaluation completed Falls prevention discussed      Assessment & Plan Obesity, improved Weight reduced from 241 lbs to 228 lbs, BMI no longer over 30. On Zepbound 10 mg, pending increase to 12.5 mg. Engaged in lifestyle modifications. - Continue Zepbound at 10 mg, increase to 12.5 mg pending authorization. - Engage with nutritionist for dietary guidance. - Consider [employer] for pre-made meals.  Hypertension Reports higher readings at night. - Continue atenolol  and triamterene -HCTZ. - Advise taking atenolol  in the evening.  Goiter Asymptomatic, no swallowing issues. Previous ultrasound showed no abnormalities requiring biopsy. - Check thyroid function tests.  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 Improved sleep, cautious use due to blood pressure impact. - Refill modafinil  if needed.  Metabolic Syndrome -seeing a dietician through weight loss program at work and also taking GLP-1 agonist, last A1C done recently and normal range  General Health Maintenance Blood work due for lipid panel and vitamin D . - Order lipid panel. - Order vitamin D  level. - Schedule physical examination.

## 2024-03-10 LAB — LIPID PANEL
Cholesterol: 167 mg/dL (ref <200)
HDL: 53 mg/dL (ref >=50)
LDL Cholesterol (Calc): 95 mg/dL
Non-HDL Cholesterol (Calc): 114 mg/dL (ref <130)
Total CHOL/HDL Ratio: 3.2 (calc) (ref <5.0)
Triglycerides: 94 mg/dL (ref <150)

## 2024-03-10 LAB — TSH: TSH: 1.34 m[IU]/L

## 2024-03-10 LAB — VITAMIN D 25 HYDROXY (VIT D DEFICIENCY, FRACTURES): Vit D, 25-Hydroxy: 46 ng/mL (ref 30–100)

## 2024-03-12 ENCOUNTER — Ambulatory Visit: Payer: Self-pay | Admitting: Family Medicine

## 2024-03-12 DIAGNOSIS — A6009 Herpesviral infection of other urogenital tract: Secondary | ICD-10-CM

## 2024-03-14 ENCOUNTER — Other Ambulatory Visit: Payer: Self-pay | Admitting: Family Medicine

## 2024-03-14 DIAGNOSIS — Z3041 Encounter for surveillance of contraceptive pills: Secondary | ICD-10-CM

## 2024-03-15 ENCOUNTER — Other Ambulatory Visit: Payer: Self-pay

## 2024-03-15 DIAGNOSIS — Z3041 Encounter for surveillance of contraceptive pills: Secondary | ICD-10-CM

## 2024-03-15 MED ORDER — ZEPBOUND 12.5 MG/0.5ML ~~LOC~~ SOAJ: 12.5000 mg | SUBCUTANEOUS | 0 refills | Status: AC

## 2024-03-15 MED ORDER — NORTREL 0.5/35 (28) 0.5-35 MG-MCG PO TABS: 1.0000 | ORAL_TABLET | Freq: Every day | ORAL | 3 refills | Status: AC

## 2024-03-16 MED ORDER — VALACYCLOVIR HCL 500 MG PO TABS: 500.0000 mg | ORAL_TABLET | Freq: Three times a day (TID) | ORAL | 0 refills | Status: AC

## 2024-04-12 NOTE — Patient Instructions (Incomplete)
 Preventive Care 58-42 Years Old, Female  Preventive care refers to lifestyle choices and visits with your health care provider that can promote health and wellness. Preventive care visits are also called wellness exams.  What can I expect for my preventive care visit?  Counseling  Your health care provider may ask you questions about your:  Medical history, including:  Past medical problems.  Family medical history.  Pregnancy history.  Current health, including:  Menstrual cycle.  Method of birth control.  Emotional well-being.  Home life and relationship well-being.  Sexual activity and sexual health.  Lifestyle, including:  Alcohol, nicotine or tobacco, and drug use.  Access to firearms.  Diet, exercise, and sleep habits.  Work and work Astronomer.  Sunscreen use.  Safety issues such as seatbelt and bike helmet use.  Physical exam  Your health care provider will check your:  Height and weight. These may be used to calculate your BMI (body mass index). BMI is a measurement that tells if you are at a healthy weight.  Waist circumference. This measures the distance around your waistline. This measurement also tells if you are at a healthy weight and may help predict your risk of certain diseases, such as type 2 diabetes and high blood pressure.  Heart rate and blood pressure.  Body temperature.  Skin for abnormal spots.  What immunizations do I need?    Vaccines are usually given at various ages, according to a schedule. Your health care provider will recommend vaccines for you based on your age, medical history, and lifestyle or other factors, such as travel or where you work.  What tests do I need?  Screening  Your health care provider may recommend screening tests for certain conditions. This may include:  Lipid and cholesterol levels.  Diabetes screening. This is done by checking your blood sugar (glucose) after you have not eaten for a while (fasting).  Pelvic exam and Pap test.  Hepatitis B test.  Hepatitis C  test.  HIV (human immunodeficiency virus) test.  STI (sexually transmitted infection) testing, if you are at risk.  Lung cancer screening.  Colorectal cancer screening.  Mammogram. Talk with your health care provider about when you should start having regular mammograms. This may depend on whether you have a family history of breast cancer.  BRCA-related cancer screening. This may be done if you have a family history of breast, ovarian, tubal, or peritoneal cancers.  Bone density scan. This is done to screen for osteoporosis.  Talk with your health care provider about your test results, treatment options, and if necessary, the need for more tests.  Follow these instructions at home:  Eating and drinking    Eat a diet that includes fresh fruits and vegetables, whole grains, lean protein, and low-fat dairy products.  Take vitamin and mineral supplements as recommended by your health care provider.  Do not drink alcohol if:  Your health care provider tells you not to drink.  You are pregnant, may be pregnant, or are planning to become pregnant.  If you drink alcohol:  Limit how much you have to 0-1 drink a day.  Know how much alcohol is in your drink. In the [place] one drink equals one 12 oz bottle of beer (355 mL), one 5 oz glass of wine (148 mL), or one 1 oz glass of hard liquor (44 mL).  Lifestyle  Brush your teeth every morning and night with fluoride toothpaste. Floss one time each day.  Exercise for at least  30 minutes 5 or more days each week.  Do not use any products that contain nicotine or tobacco. These products include cigarettes, chewing tobacco, and vaping devices, such as e-cigarettes. If you need help quitting, ask your health care provider.  Do not use drugs.  If you are sexually active, practice safe sex. Use a condom or other form of protection to prevent STIs.  If you do not wish to become pregnant, use a form of birth control. If you plan to become pregnant, see your health care provider for a  prepregnancy visit.  Take aspirin only as told by your health care provider. Make sure that you understand how much to take and what form to take. Work with your health care provider to find out whether it is safe and beneficial for you to take aspirin daily.  Find healthy ways to manage stress, such as:  Meditation, yoga, or listening to music.  Journaling.  Talking to a trusted person.  Spending time with friends and family.  Minimize exposure to UV radiation to reduce your risk of skin cancer.  Safety  Always wear your seat belt while driving or riding in a vehicle.  Do not drive:  If you have been drinking alcohol. Do not ride with someone who has been drinking.  When you are tired or distracted.  While texting.  If you have been using any mind-altering substances or drugs.  Wear a helmet and other protective equipment during sports activities.  If you have firearms in your house, make sure you follow all gun safety procedures.  Seek help if you have been physically or sexually abused.  What's next?  Visit your health care provider once a year for an annual wellness visit.  Ask your health care provider how often you should have your eyes and teeth checked.  Stay up to date on all vaccines.  This information is not intended to replace advice given to you by your health care provider. Make sure you discuss any questions you have with your health care provider.  Document Revised: 11/12/2020 Document Reviewed: 11/12/2020  Elsevier Patient Education  2024 [employer].

## 2024-04-13 ENCOUNTER — Encounter: Payer: Self-pay | Admitting: Family Medicine

## 2024-04-28 ENCOUNTER — Other Ambulatory Visit: Payer: Self-pay | Admitting: Family Medicine

## 2024-04-28 DIAGNOSIS — I10 Essential (primary) hypertension: Secondary | ICD-10-CM

## 2024-05-02 NOTE — Telephone Encounter (Signed)
 Rx 03/16/24 #90 1RF- too soon Requested Prescriptions  Pending Prescriptions Disp Refills   triamterene -hydrochlorothiazide  (DYAZIDE) 37.5-25 MG capsule [Pharmacy Med Name: TRIAMTERENE  37.5MG / HCTZ 25MG  CAPS] 90 capsule 1    Sig: TAKE 1 CAPSULE BY MOUTH DAILY     Cardiovascular: Diuretic Combos Failed - 05/02/2024 11:51 AM      Failed - K in normal range and within 180 days    Potassium  Date Value Ref Range Status  03/10/2023 4.4 3.5 - 5.3 mmol/L Final  02/08/2014 3.8 3.5 - 5.1 mmol/L Final         Failed - Na in normal range and within 180 days    Sodium  Date Value Ref Range Status  03/10/2023 139 135 - 146 mmol/L Final  04/22/2017 140 137 - 147 Final  02/08/2014 140 136 - 145 mmol/L Final         Failed - Cr in normal range and within 180 days    Creat  Date Value Ref Range Status  03/10/2023 0.76 0.50 - 0.99 mg/dL Final   Creatinine, Urine  Date Value Ref Range Status  09/10/2020 145 20 - 275 mg/dL Final         Passed - Last BP in normal range    BP Readings from Last 1 Encounters:  03/09/24 126/78         Passed - Valid encounter within last 6 months    Recent Outpatient Visits           1 month ago Obesity (BMI 30.0-34.9)   [facility] Glenard Mire, MD   7 months ago Dyslipidemia   [facility] Sowles, Krichna, MD

## 2024-09-13 ENCOUNTER — Ambulatory Visit: Admitting: Family Medicine
